# Patient Record
Sex: Female | Born: 1965 | Race: Black or African American | Hispanic: No | Marital: Single | Smoking: Never smoker
Health system: Southern US, Community
[De-identification: ages and names within clinical notes are randomized; demographics above are authoritative.]

## PROBLEM LIST (undated history)

## (undated) DIAGNOSIS — D332 Benign neoplasm of brain, unspecified: Secondary | ICD-10-CM

## (undated) HISTORY — PX: CARPAL TUNNEL RELEASE: SHX101

## (undated) HISTORY — PX: COLONOSCOPY: SHX174

## (undated) HISTORY — PX: TUBAL LIGATION: SHX77

---

## 2016-01-09 ENCOUNTER — Other Ambulatory Visit: Payer: Self-pay | Admitting: Neurosurgery

## 2016-01-09 DIAGNOSIS — D330 Benign neoplasm of brain, supratentorial: Secondary | ICD-10-CM

## 2016-01-17 ENCOUNTER — Other Ambulatory Visit: Payer: Self-pay

## 2016-01-18 ENCOUNTER — Ambulatory Visit
Admission: RE | Admit: 2016-01-18 | Discharge: 2016-01-18 | Disposition: A | Discharge status: Home / Self Care | Payer: BLUE CROSS/BLUE SHIELD | Source: Ambulatory Visit | Attending: Neurosurgery | Admitting: Neurosurgery

## 2016-01-18 DIAGNOSIS — D330 Benign neoplasm of brain, supratentorial: Secondary | ICD-10-CM

## 2016-01-18 MED ORDER — GADOBENATE DIMEGLUMINE 529 MG/ML IV SOLN
14.0000 mL | Freq: Once | INTRAVENOUS | Status: AC | PRN
  Administered 2016-01-18: 14 mL via INTRAVENOUS

## 2016-03-14 ENCOUNTER — Other Ambulatory Visit: Payer: Self-pay | Admitting: Neurosurgery

## 2016-08-14 NOTE — Pre-Procedure Instructions (Signed)
    Dana Hutchinson  08/14/2016     No Pharmacies Listed   Your procedure is scheduled on August 23, 2016.  Report to [facility] Admitting at 530 AM.  Call this number if you have problems the morning of surgery:  [phone]   Remember:  Do not eat food or drink liquids after midnight.  Take these medicines the morning of surgery with A SIP OF WATER (none).  7 days prior to surgery STOP taking any Aspirin, Aleve, Naproxen, Ibuprofen, Motrin, Advil, Goody's, BC's, all herbal medications, fish oil, and all vitamins   Do not wear jewelry, make-up or nail polish.  Do not wear lotions, powders, or perfumes, or deoderant.  Do not shave 48 hours prior to surgery.    Do not bring valuables to the hospital.  [facility] is not responsible for any belongings or valuables.  Contacts, dentures or bridgework may not be worn into surgery.  Leave your suitcase in the car.  After surgery it may be brought to your room.  For patients admitted to the hospital, discharge time will be determined by your treatment team.  Patients discharged the day of surgery will not be allowed to drive home.   Special instructions:   [facility]- Preparing For Surgery  Before surgery, you can play an important role. Because skin is not sterile, your skin needs to be as free of germs as possible. You can reduce the number of germs on your skin by washing with CHG (chlorahexidine gluconate) Soap before surgery.  CHG is an antiseptic cleaner which kills germs and bonds with the skin to continue killing germs even after washing.  Please do not use if you have an allergy to CHG or antibacterial soaps. If your skin becomes reddened/irritated stop using the CHG.  Do not shave (including legs and underarms) for at least 48 hours prior to first CHG shower. It is OK to shave your face.  Please follow these instructions carefully.   1. Shower the NIGHT BEFORE SURGERY and the MORNING OF SURGERY with CHG.   2. If you  chose to wash your hair, wash your hair first as usual with your normal shampoo.  3. After you shampoo, rinse your hair and body thoroughly to remove the shampoo.  4. Use CHG as you would any other liquid soap. You can apply CHG directly to the skin and wash gently with a scrungie or a clean washcloth.   5. Apply the CHG Soap to your body ONLY FROM THE NECK DOWN.  Do not use on open wounds or open sores. Avoid contact with your eyes, ears, mouth and genitals (private parts). Wash genitals (private parts) with your normal soap.  6. Wash thoroughly, paying special attention to the area where your surgery will be performed.  7. Thoroughly rinse your body with warm water from the neck down.  8. DO NOT shower/wash with your normal soap after using and rinsing off the CHG Soap.  9. Pat yourself dry with a CLEAN TOWEL.   10. Wear CLEAN PAJAMAS   11. Place CLEAN SHEETS on your bed the night of your first shower and DO NOT SLEEP WITH PETS.    Day of Surgery: Do not apply any deodorants/lotions. Please wear clean clothes to the hospital/surgery center.     Please read over the following fact sheets that you were given. Pain Booklet, Coughing and Deep Breathing and Surgical Site Infection Prevention

## 2016-08-15 ENCOUNTER — Inpatient Hospital Stay (HOSPITAL_COMMUNITY)
Admission: RE | Admit: 2016-08-15 | Discharge: 2016-08-15 | Disposition: A | Discharge status: Home / Self Care | Payer: BLUE CROSS/BLUE SHIELD | Source: Ambulatory Visit

## 2016-08-21 ENCOUNTER — Encounter (HOSPITAL_COMMUNITY)
Admission: RE | Admit: 2016-08-21 | Discharge: 2016-08-21 | Disposition: A | Discharge status: Home / Self Care | Payer: BLUE CROSS/BLUE SHIELD | Source: Ambulatory Visit | Attending: Neurosurgery | Admitting: Neurosurgery

## 2016-08-21 ENCOUNTER — Encounter (HOSPITAL_COMMUNITY): Payer: Self-pay | Admitting: *Deleted

## 2016-08-21 HISTORY — DX: Benign neoplasm of brain, unspecified: D33.2

## 2016-08-21 LAB — CBC
HEMATOCRIT: 38.6 % (ref 36.0–46.0)
HEMOGLOBIN: 12.6 g/dL (ref 12.0–15.0)
MCH: 28.3 pg (ref 26.0–34.0)
MCHC: 32.6 g/dL (ref 30.0–36.0)
MCV: 86.7 fL (ref 78.0–100.0)
Platelets: 288 10*3/uL (ref 150–400)
RBC: 4.45 MIL/uL (ref 3.87–5.11)
RDW: 12.6 % (ref 11.5–15.5)
WBC: 5.2 10*3/uL (ref 4.0–10.5)

## 2016-08-21 LAB — BASIC METABOLIC PANEL
ANION GAP: 7 (ref 5–15)
BUN: 10 mg/dL (ref 6–20)
CHLORIDE: 107 mmol/L (ref 101–111)
CO2: 25 mmol/L (ref 22–32)
Calcium: 8.8 mg/dL — ABNORMAL LOW (ref 8.9–10.3)
Creatinine, Ser: 0.64 mg/dL (ref 0.44–1.00)
GFR calc Af Amer: >60 mL/min (ref >60)
GFR calc non Af Amer: >60 mL/min (ref >60)
Glucose, Bld: 84 mg/dL (ref 65–99)
POTASSIUM: 3.6 mmol/L (ref 3.5–5.1)
SODIUM: 139 mmol/L (ref 135–145)

## 2016-08-21 LAB — TYPE AND SCREEN
ABO/RH(D): A NEG
ANTIBODY SCREEN: NEGATIVE

## 2016-08-21 LAB — HCG, SERUM, QUALITATIVE: Preg, Serum: NEGATIVE

## 2016-08-21 LAB — ABO/RH: ABO/RH(D): A NEG

## 2016-08-21 NOTE — Progress Notes (Signed)
PCP - denies Cardiologist -denies   Chest x-ray - not needed EKG - 08/21/16 Stress Test - denies ECHO - denies Cardiac Cath - denies       Patient denies shortness of breath, fever, cough and chest pain at PAT appointment   Patient verbalized understanding of instructions that were given to them at the PAT appointment. Patient was also instructed that they will need to review over the PAT instructions again at home before surgery.

## 2016-08-22 MED ORDER — CEFAZOLIN SODIUM-DEXTROSE 2-4 GM/100ML-% IV SOLN
2.0000 g | INTRAVENOUS | Status: AC
  Administered 2016-08-23: 2 g via INTRAVENOUS
  Filled 2016-08-22: qty 100

## 2016-08-23 ENCOUNTER — Encounter (HOSPITAL_COMMUNITY)
Admission: RE | Disposition: A | Discharge status: Home / Self Care | Payer: Self-pay | Source: Ambulatory Visit | Attending: Neurosurgery

## 2016-08-23 ENCOUNTER — Inpatient Hospital Stay (HOSPITAL_COMMUNITY): Payer: BLUE CROSS/BLUE SHIELD | Admitting: Certified Registered"

## 2016-08-23 ENCOUNTER — Encounter (HOSPITAL_COMMUNITY): Payer: Self-pay | Admitting: *Deleted

## 2016-08-23 ENCOUNTER — Inpatient Hospital Stay (HOSPITAL_COMMUNITY)
Admission: RE | Admit: 2016-08-23 | Discharge: 2016-08-27 | DRG: 027 | Disposition: A | Discharge status: Home / Self Care | Payer: BLUE CROSS/BLUE SHIELD | Source: Ambulatory Visit | Attending: Neurosurgery | Admitting: Neurosurgery

## 2016-08-23 ENCOUNTER — Inpatient Hospital Stay (HOSPITAL_COMMUNITY): Payer: BLUE CROSS/BLUE SHIELD | Admitting: Vascular Surgery

## 2016-08-23 DIAGNOSIS — C715 Malignant neoplasm of cerebral ventricle: Principal | ICD-10-CM | POA: Diagnosis present

## 2016-08-23 DIAGNOSIS — Z9851 Tubal ligation status: Secondary | ICD-10-CM | POA: Diagnosis not present

## 2016-08-23 DIAGNOSIS — D496 Neoplasm of unspecified behavior of brain: Secondary | ICD-10-CM | POA: Diagnosis present

## 2016-08-23 DIAGNOSIS — W19XXXA Unspecified fall, initial encounter: Secondary | ICD-10-CM | POA: Diagnosis present

## 2016-08-23 DIAGNOSIS — R51 Headache: Secondary | ICD-10-CM | POA: Diagnosis present

## 2016-08-23 DIAGNOSIS — Z9889 Other specified postprocedural states: Secondary | ICD-10-CM

## 2016-08-23 HISTORY — PX: APPLICATION OF CRANIAL NAVIGATION: SHX6578

## 2016-08-23 HISTORY — PX: CRANIOTOMY: SHX93

## 2016-08-23 LAB — POCT I-STAT 7, (LYTES, BLD GAS, ICA,H+H)
Acid-base deficit: 3 mmol/L — ABNORMAL HIGH (ref 0.0–2.0)
BICARBONATE: 20.9 mmol/L (ref 20.0–28.0)
Calcium, Ion: 1.09 mmol/L — ABNORMAL LOW (ref 1.15–1.40)
HCT: 28 % — ABNORMAL LOW (ref 36.0–46.0)
Hemoglobin: 9.5 g/dL — ABNORMAL LOW (ref 12.0–15.0)
O2 Saturation: 100 %
PCO2 ART: 30.7 mmHg — AB (ref 32.0–48.0)
POTASSIUM: 3.2 mmol/L — AB (ref 3.5–5.1)
Sodium: 144 mmol/L (ref 135–145)
TCO2: 22 mmol/L (ref 0–100)
pH, Arterial: 7.435 (ref 7.350–7.450)
pO2, Arterial: 281 mmHg — ABNORMAL HIGH (ref 83.0–108.0)

## 2016-08-23 LAB — MRSA PCR SCREENING: MRSA by PCR: NEGATIVE

## 2016-08-23 SURGERY — CRANIOTOMY TUMOR EXCISION
Anesthesia: General | Site: Head | Laterality: Right

## 2016-08-23 MED ORDER — THROMBIN 5000 UNITS EX SOLR
OROMUCOSAL | Status: DC | PRN
  Administered 2016-08-23: 10:00:00 via TOPICAL

## 2016-08-23 MED ORDER — MIDAZOLAM HCL 2 MG/2ML IJ SOLN
INTRAMUSCULAR | Status: AC
  Filled 2016-08-23: qty 2

## 2016-08-23 MED ORDER — HYDROMORPHONE HCL 1 MG/ML IJ SOLN: 0.2500 mg | INTRAMUSCULAR | Status: DC | PRN

## 2016-08-23 MED ORDER — MORPHINE SULFATE (PF) 4 MG/ML IV SOLN
1.0000 mg | INTRAVENOUS | Status: DC | PRN
  Administered 2016-08-23: 1 mg via INTRAVENOUS
  Filled 2016-08-23: qty 1

## 2016-08-23 MED ORDER — PROPOFOL 10 MG/ML IV BOLUS
INTRAVENOUS | Status: AC
  Filled 2016-08-23: qty 20

## 2016-08-23 MED ORDER — LIDOCAINE 2% (20 MG/ML) 5 ML SYRINGE
INTRAMUSCULAR | Status: AC
  Filled 2016-08-23: qty 5

## 2016-08-23 MED ORDER — PHENYLEPHRINE HCL 10 MG/ML IJ SOLN
INTRAVENOUS | Status: DC | PRN
  Administered 2016-08-23: 40 ug/min via INTRAVENOUS

## 2016-08-23 MED ORDER — EPHEDRINE SULFATE-NACL 50-0.9 MG/10ML-% IV SOSY
PREFILLED_SYRINGE | INTRAVENOUS | Status: DC | PRN
  Administered 2016-08-23: 10 mg via INTRAVENOUS

## 2016-08-23 MED ORDER — BACITRACIN ZINC 500 UNIT/GM EX OINT
TOPICAL_OINTMENT | CUTANEOUS | Status: AC
  Filled 2016-08-23: qty 28.35

## 2016-08-23 MED ORDER — PHENYLEPHRINE 40 MCG/ML (10ML) SYRINGE FOR IV PUSH (FOR BLOOD PRESSURE SUPPORT)
PREFILLED_SYRINGE | INTRAVENOUS | Status: DC | PRN
  Administered 2016-08-23 (×3): 80 ug via INTRAVENOUS

## 2016-08-23 MED ORDER — PHENYLEPHRINE 40 MCG/ML (10ML) SYRINGE FOR IV PUSH (FOR BLOOD PRESSURE SUPPORT)
PREFILLED_SYRINGE | INTRAVENOUS | Status: AC
  Filled 2016-08-23: qty 10

## 2016-08-23 MED ORDER — DEXAMETHASONE SODIUM PHOSPHATE 10 MG/ML IJ SOLN
6.0000 mg | Freq: Four times a day (QID) | INTRAMUSCULAR | Status: AC
  Administered 2016-08-23 – 2016-08-24 (×3): 6 mg via INTRAVENOUS
  Filled 2016-08-23 (×3): qty 1

## 2016-08-23 MED ORDER — DEXAMETHASONE SODIUM PHOSPHATE 10 MG/ML IJ SOLN
INTRAMUSCULAR | Status: AC
  Filled 2016-08-23: qty 1

## 2016-08-23 MED ORDER — LABETALOL HCL 5 MG/ML IV SOLN: 10.0000 mg | INTRAVENOUS | Status: DC | PRN

## 2016-08-23 MED ORDER — FENTANYL CITRATE (PF) 250 MCG/5ML IJ SOLN
INTRAMUSCULAR | Status: AC
  Filled 2016-08-23: qty 5

## 2016-08-23 MED ORDER — BACITRACIN ZINC 500 UNIT/GM EX OINT
TOPICAL_OINTMENT | CUTANEOUS | Status: DC | PRN
  Administered 2016-08-23 (×2): 1 via TOPICAL

## 2016-08-23 MED ORDER — DOCUSATE SODIUM 100 MG PO CAPS
100.0000 mg | ORAL_CAPSULE | Freq: Two times a day (BID) | ORAL | Status: DC
  Administered 2016-08-23 – 2016-08-27 (×8): 100 mg via ORAL
  Filled 2016-08-23 (×8): qty 1

## 2016-08-23 MED ORDER — ONDANSETRON HCL 4 MG/2ML IJ SOLN
INTRAMUSCULAR | Status: DC | PRN
  Administered 2016-08-23: 4 mg via INTRAVENOUS

## 2016-08-23 MED ORDER — MEPERIDINE HCL 25 MG/ML IJ SOLN: 6.2500 mg | INTRAMUSCULAR | Status: DC | PRN

## 2016-08-23 MED ORDER — ARTIFICIAL TEARS OPHTHALMIC OINT
TOPICAL_OINTMENT | OPHTHALMIC | Status: AC
  Filled 2016-08-23: qty 3.5

## 2016-08-23 MED ORDER — ONDANSETRON HCL 4 MG/2ML IJ SOLN
4.0000 mg | INTRAMUSCULAR | Status: DC | PRN
  Administered 2016-08-23: 4 mg via INTRAVENOUS

## 2016-08-23 MED ORDER — ROCURONIUM BROMIDE 100 MG/10ML IV SOLN
INTRAVENOUS | Status: DC | PRN
  Administered 2016-08-23: 50 mg via INTRAVENOUS
  Administered 2016-08-23 (×6): 10 mg via INTRAVENOUS

## 2016-08-23 MED ORDER — DEXAMETHASONE SODIUM PHOSPHATE 10 MG/ML IJ SOLN
INTRAMUSCULAR | Status: DC | PRN
  Administered 2016-08-23: 10 mg via INTRAVENOUS

## 2016-08-23 MED ORDER — THROMBIN 5000 UNITS EX SOLR
CUTANEOUS | Status: AC
  Filled 2016-08-23: qty 5000

## 2016-08-23 MED ORDER — SODIUM CHLORIDE 0.9 % IV SOLN
1000.0000 mg | INTRAVENOUS | Status: AC
  Administered 2016-08-23: 1000 mg via INTRAVENOUS
  Filled 2016-08-23: qty 10

## 2016-08-23 MED ORDER — DEXAMETHASONE SODIUM PHOSPHATE 4 MG/ML IJ SOLN
4.0000 mg | Freq: Three times a day (TID) | INTRAMUSCULAR | Status: DC
  Administered 2016-08-25 – 2016-08-27 (×6): 4 mg via INTRAVENOUS
  Filled 2016-08-23 (×6): qty 1

## 2016-08-23 MED ORDER — ONDANSETRON HCL 4 MG PO TABS: 4.0000 mg | ORAL_TABLET | ORAL | Status: DC | PRN

## 2016-08-23 MED ORDER — SUGAMMADEX SODIUM 200 MG/2ML IV SOLN
INTRAVENOUS | Status: DC | PRN
  Administered 2016-08-23: 200 mg via INTRAVENOUS

## 2016-08-23 MED ORDER — PROMETHAZINE HCL 12.5 MG PO TABS
12.5000 mg | ORAL_TABLET | ORAL | Status: DC | PRN
  Filled 2016-08-23: qty 2

## 2016-08-23 MED ORDER — BUPIVACAINE HCL (PF) 0.5 % IJ SOLN
INTRAMUSCULAR | Status: DC | PRN
  Administered 2016-08-23: 5 mL

## 2016-08-23 MED ORDER — PANTOPRAZOLE SODIUM 40 MG IV SOLR
40.0000 mg | Freq: Every day | INTRAVENOUS | Status: DC
  Administered 2016-08-23 – 2016-08-25 (×3): 40 mg via INTRAVENOUS
  Filled 2016-08-23 (×3): qty 40

## 2016-08-23 MED ORDER — ONDANSETRON HCL 4 MG/2ML IJ SOLN
4.0000 mg | Freq: Once | INTRAMUSCULAR | Status: DC | PRN
  Filled 2016-08-23: qty 2

## 2016-08-23 MED ORDER — ROCURONIUM BROMIDE 10 MG/ML (PF) SYRINGE
PREFILLED_SYRINGE | INTRAVENOUS | Status: AC
  Filled 2016-08-23: qty 5

## 2016-08-23 MED ORDER — HEMOSTATIC AGENTS (NO CHARGE) OPTIME
TOPICAL | Status: DC | PRN
  Administered 2016-08-23: 1 via TOPICAL

## 2016-08-23 MED ORDER — EPHEDRINE 5 MG/ML INJ
INTRAVENOUS | Status: AC
  Filled 2016-08-23: qty 10

## 2016-08-23 MED ORDER — SODIUM CHLORIDE 0.9 % IV SOLN
500.0000 mg | Freq: Two times a day (BID) | INTRAVENOUS | Status: DC
  Administered 2016-08-23 – 2016-08-24 (×2): 500 mg via INTRAVENOUS
  Filled 2016-08-23 (×3): qty 5

## 2016-08-23 MED ORDER — LIDOCAINE 2% (20 MG/ML) 5 ML SYRINGE
INTRAMUSCULAR | Status: DC | PRN
  Administered 2016-08-23: 100 mg via INTRAVENOUS

## 2016-08-23 MED ORDER — SODIUM CHLORIDE 0.9 % IV SOLN
INTRAVENOUS | Status: DC | PRN
  Administered 2016-08-23 (×2): via INTRAVENOUS

## 2016-08-23 MED ORDER — SODIUM CHLORIDE 0.9 % IV SOLN
INTRAVENOUS | Status: DC
  Administered 2016-08-24 – 2016-08-25 (×3): via INTRAVENOUS

## 2016-08-23 MED ORDER — THROMBIN 20000 UNITS EX SOLR
CUTANEOUS | Status: AC
  Filled 2016-08-23: qty 20000

## 2016-08-23 MED ORDER — THROMBIN 20000 UNITS EX SOLR
CUTANEOUS | Status: DC | PRN
  Administered 2016-08-23: 10:00:00 via TOPICAL

## 2016-08-23 MED ORDER — SUGAMMADEX SODIUM 200 MG/2ML IV SOLN
INTRAVENOUS | Status: AC
  Filled 2016-08-23: qty 2

## 2016-08-23 MED ORDER — ADULT MULTIVITAMIN W/MINERALS CH
1.0000 | ORAL_TABLET | Freq: Every day | ORAL | Status: DC
  Administered 2016-08-24 – 2016-08-27 (×3): 1 via ORAL
  Filled 2016-08-23 (×6): qty 1

## 2016-08-23 MED ORDER — SUCCINYLCHOLINE CHLORIDE 200 MG/10ML IV SOSY
PREFILLED_SYRINGE | INTRAVENOUS | Status: AC
  Filled 2016-08-23: qty 10

## 2016-08-23 MED ORDER — CEFAZOLIN SODIUM-DEXTROSE 1-4 GM/50ML-% IV SOLN
1.0000 g | Freq: Three times a day (TID) | INTRAVENOUS | Status: AC
  Administered 2016-08-23 – 2016-08-24 (×2): 1 g via INTRAVENOUS
  Filled 2016-08-23 (×4): qty 50

## 2016-08-23 MED ORDER — LABETALOL HCL 5 MG/ML IV SOLN
INTRAVENOUS | Status: DC | PRN
  Administered 2016-08-23: 10 mg via INTRAVENOUS

## 2016-08-23 MED ORDER — FENTANYL CITRATE (PF) 100 MCG/2ML IJ SOLN
INTRAMUSCULAR | Status: DC | PRN
  Administered 2016-08-23: 50 ug via INTRAVENOUS
  Administered 2016-08-23: 200 ug via INTRAVENOUS

## 2016-08-23 MED ORDER — SENNA 8.6 MG PO TABS
1.0000 | ORAL_TABLET | Freq: Two times a day (BID) | ORAL | Status: DC
  Administered 2016-08-23 – 2016-08-27 (×7): 8.6 mg via ORAL
  Filled 2016-08-23 (×8): qty 1

## 2016-08-23 MED ORDER — BISACODYL 10 MG RE SUPP: 10.0000 mg | Freq: Every day | RECTAL | Status: DC | PRN

## 2016-08-23 MED ORDER — PROPOFOL 10 MG/ML IV BOLUS
INTRAVENOUS | Status: DC | PRN
  Administered 2016-08-23: 200 mg via INTRAVENOUS
  Administered 2016-08-23: 50 mg via INTRAVENOUS

## 2016-08-23 MED ORDER — HYDROCODONE-ACETAMINOPHEN 5-325 MG PO TABS
1.0000 | ORAL_TABLET | ORAL | Status: DC | PRN
  Administered 2016-08-23 – 2016-08-27 (×13): 1 via ORAL
  Filled 2016-08-23 (×14): qty 1

## 2016-08-23 MED ORDER — MORPHINE SULFATE (PF) 2 MG/ML IV SOLN: 1.0000 mg | INTRAVENOUS | Status: DC | PRN

## 2016-08-23 MED ORDER — DEXAMETHASONE SODIUM PHOSPHATE 4 MG/ML IJ SOLN
4.0000 mg | Freq: Four times a day (QID) | INTRAMUSCULAR | Status: AC
  Administered 2016-08-24 – 2016-08-25 (×4): 4 mg via INTRAVENOUS
  Filled 2016-08-23 (×4): qty 1

## 2016-08-23 MED ORDER — BACITRACIN 50000 UNITS IM SOLR
INTRAMUSCULAR | Status: DC | PRN
  Administered 2016-08-23: 10:00:00

## 2016-08-23 MED ORDER — LIDOCAINE-EPINEPHRINE 1 %-1:100000 IJ SOLN
INTRAMUSCULAR | Status: DC | PRN
  Administered 2016-08-23: 5 mL

## 2016-08-23 MED ORDER — 0.9 % SODIUM CHLORIDE (POUR BTL) OPTIME
TOPICAL | Status: DC | PRN
  Administered 2016-08-23 (×4): 1000 mL

## 2016-08-23 MED ORDER — LIDOCAINE-EPINEPHRINE 1 %-1:100000 IJ SOLN
INTRAMUSCULAR | Status: AC
  Filled 2016-08-23: qty 1

## 2016-08-23 MED ORDER — ONDANSETRON HCL 4 MG/2ML IJ SOLN
INTRAMUSCULAR | Status: AC
  Filled 2016-08-23: qty 2

## 2016-08-23 MED ORDER — ONDANSETRON HCL 4 MG/2ML IJ SOLN: 4.0000 mg | Freq: Once | INTRAMUSCULAR | Status: DC | PRN

## 2016-08-23 MED ORDER — BUPIVACAINE HCL (PF) 0.5 % IJ SOLN
INTRAMUSCULAR | Status: AC
  Filled 2016-08-23: qty 30

## 2016-08-23 SURGICAL SUPPLY — 113 items
ACCESS SYS VIEWSITE 21X15X7 (NEUROSURGERY SUPPLIES) ×2
ACCESS SYS VIEWSITE 21X15X7CM (NEUROSURGERY SUPPLIES) ×1
BANDAGE GAUZE 4  KLING STR (GAUZE/BANDAGES/DRESSINGS) ×3 IMPLANT
BATTERY IQ STERILE (MISCELLANEOUS) ×3 IMPLANT
BENZOIN TINCTURE PRP APPL 2/3 (GAUZE/BANDAGES/DRESSINGS) IMPLANT
BLADE CLIPPER SURG (BLADE) ×6 IMPLANT
BLADE SAW GIGLI 16 STRL (MISCELLANEOUS) IMPLANT
BLADE SURG 15 STRL LF DISP TIS (BLADE) IMPLANT
BLADE SURG 15 STRL SS (BLADE)
BLADE ULTRA TIP 2M (BLADE) IMPLANT
BNDG GAUZE ELAST 4 BULKY (GAUZE/BANDAGES/DRESSINGS) ×6 IMPLANT
BUR ACORN 6.0 PRECISION (BURR) ×2 IMPLANT
BUR ACORN 6.0MM PRECISION (BURR) ×1
BUR ROUND FLUTED 4 SOFT TCH (BURR) IMPLANT
BUR ROUND FLUTED 4MM SOFT TCH (BURR)
BUR SPIRAL ROUTER 2.3 (BUR) ×2 IMPLANT
BUR SPIRAL ROUTER 2.3MM (BUR) ×1
CANISTER SUCT 3000ML PPV (MISCELLANEOUS) ×6 IMPLANT
CARTRIDGE OIL MAESTRO DRILL (MISCELLANEOUS) ×2 IMPLANT
CATH VENTRIC 35X38 W/TROCAR LG (CATHETERS) ×3 IMPLANT
CLIP TI MEDIUM 6 (CLIP) IMPLANT
CONT SPEC 4OZ CLIKSEAL STRL BL (MISCELLANEOUS) ×3 IMPLANT
COVER MAYO STAND STRL (DRAPES) IMPLANT
CURVED EXTENDED SHEAR TIP ×3 IMPLANT
DECANTER SPIKE VIAL GLASS SM (MISCELLANEOUS) ×3 IMPLANT
DIFFUSER DRILL AIR PNEUMATIC (MISCELLANEOUS) ×3 IMPLANT
DRAIN SUBARACHNOID (WOUND CARE) IMPLANT
DRAPE HALF SHEET 40X57 (DRAPES) ×3 IMPLANT
DRAPE MICROSCOPE LEICA (MISCELLANEOUS) ×3 IMPLANT
DRAPE NEUROLOGICAL W/INCISE (DRAPES) ×3 IMPLANT
DRAPE STERI IOBAN 125X83 (DRAPES) IMPLANT
DRAPE SURG 17X23 STRL (DRAPES) IMPLANT
DRAPE WARM FLUID 44X44 (DRAPE) ×3 IMPLANT
DRSG ADAPTIC 3X8 NADH LF (GAUZE/BANDAGES/DRESSINGS) IMPLANT
DRSG TELFA 3X8 NADH (GAUZE/BANDAGES/DRESSINGS) ×3 IMPLANT
DURAMATRIX ONLAY 3X3 (Plate) ×3 IMPLANT
DURAPREP 6ML APPLICATOR 50/CS (WOUND CARE) ×3 IMPLANT
ELECT REM PT RETURN 9FT ADLT (ELECTROSURGICAL) ×3
ELECTRODE REM PT RTRN 9FT ADLT (ELECTROSURGICAL) ×1 IMPLANT
EVACUATOR 1/8 PVC DRAIN (DRAIN) IMPLANT
EVACUATOR SILICONE 100CC (DRAIN) IMPLANT
FORCEPS BIPOLAR SPETZLER 8 1.0 (NEUROSURGERY SUPPLIES) ×3 IMPLANT
FORCEPS BIPOLAR TI 190MM (INSTRUMENTS) ×3 IMPLANT
GAUZE SPONGE 4X4 12PLY STRL (GAUZE/BANDAGES/DRESSINGS) IMPLANT
GAUZE SPONGE 4X4 16PLY XRAY LF (GAUZE/BANDAGES/DRESSINGS) IMPLANT
GLOVE BIO SURGEON STRL SZ7 (GLOVE) IMPLANT
GLOVE BIOGEL PI IND STRL 7.0 (GLOVE) IMPLANT
GLOVE BIOGEL PI IND STRL 7.5 (GLOVE) ×5 IMPLANT
GLOVE BIOGEL PI INDICATOR 7.0 (GLOVE)
GLOVE BIOGEL PI INDICATOR 7.5 (GLOVE) ×10
GLOVE ECLIPSE 6.5 STRL STRAW (GLOVE) ×3 IMPLANT
GLOVE ECLIPSE 7.0 STRL STRAW (GLOVE) ×12 IMPLANT
GLOVE EXAM NITRILE LRG STRL (GLOVE) IMPLANT
GLOVE EXAM NITRILE XL STR (GLOVE) IMPLANT
GLOVE EXAM NITRILE XS STR PU (GLOVE) IMPLANT
GOWN STRL REUS W/ TWL LRG LVL3 (GOWN DISPOSABLE) ×4 IMPLANT
GOWN STRL REUS W/ TWL XL LVL3 (GOWN DISPOSABLE) ×2 IMPLANT
GOWN STRL REUS W/TWL 2XL LVL3 (GOWN DISPOSABLE) IMPLANT
GOWN STRL REUS W/TWL LRG LVL3 (GOWN DISPOSABLE) ×8
GOWN STRL REUS W/TWL XL LVL3 (GOWN DISPOSABLE) ×4
HEMOSTAT POWDER KIT SURGIFOAM (HEMOSTASIS) ×3 IMPLANT
HEMOSTAT SURGICEL 2X14 (HEMOSTASIS) ×3 IMPLANT
HOOK DURA 1/2IN (MISCELLANEOUS) ×3 IMPLANT
IV NS 1000ML (IV SOLUTION) ×2
IV NS 1000ML BAXH (IV SOLUTION) ×1 IMPLANT
KIT BASIN OR (CUSTOM PROCEDURE TRAY) ×3 IMPLANT
KIT DRAIN CSF ACCUDRAIN (MISCELLANEOUS) ×3 IMPLANT
KIT ROOM TURNOVER OR (KITS) ×3 IMPLANT
KNIFE ARACHNOID DISP AM-24-S (MISCELLANEOUS) IMPLANT
MARKER SKIN DUAL TIP RULER LAB (MISCELLANEOUS) ×6 IMPLANT
MARKER SPHERE PSV REFLC 13MM (MARKER) ×6 IMPLANT
NEEDLE HYPO 22GX1.5 SAFETY (NEEDLE) ×3 IMPLANT
NEEDLE SPNL 18GX3.5 QUINCKE PK (NEEDLE) IMPLANT
NS IRRIG 1000ML POUR BTL (IV SOLUTION) ×12 IMPLANT
OIL CARTRIDGE MAESTRO DRILL (MISCELLANEOUS) ×6
PACK CRANIOTOMY (CUSTOM PROCEDURE TRAY) ×3 IMPLANT
PATTIES SURGICAL .25X.25 (GAUZE/BANDAGES/DRESSINGS) IMPLANT
PATTIES SURGICAL .5 X.5 (GAUZE/BANDAGES/DRESSINGS) ×3 IMPLANT
PATTIES SURGICAL .5 X3 (DISPOSABLE) IMPLANT
PATTIES SURGICAL 1/4 X 3 (GAUZE/BANDAGES/DRESSINGS) IMPLANT
PATTIES SURGICAL 1X1 (DISPOSABLE) IMPLANT
PIN MAYFIELD SKULL DISP (PIN) ×3 IMPLANT
PLATE 1.5/0.5 13MM BURR HOLE (Plate) ×6 IMPLANT
PLATE SHUNT (Plate) ×3 IMPLANT
QUICK CONNECT CARTRIDGE AND TUBING SET ×3 IMPLANT
RUBBERBAND STERILE (MISCELLANEOUS) ×6 IMPLANT
SCREW SELF DRILL HT 1.5/4MM (Screw) ×30 IMPLANT
SET TUBING W/EXT DISP (INSTRUMENTS) IMPLANT
SPECIMEN JAR SMALL (MISCELLANEOUS) IMPLANT
SPONGE NEURO XRAY DETECT 1X3 (DISPOSABLE) IMPLANT
SPONGE SURGIFOAM ABS GEL 100 (HEMOSTASIS) ×3 IMPLANT
STAPLER VISISTAT 35W (STAPLE) ×6 IMPLANT
STOCKINETTE 6  STRL (DRAPES) ×2
STOCKINETTE 6 STRL (DRAPES) ×1 IMPLANT
SUT ETHILON 3 0 FSL (SUTURE) IMPLANT
SUT ETHILON 3 0 PS 1 (SUTURE) IMPLANT
SUT NURALON 4 0 TR CR/8 (SUTURE) ×6 IMPLANT
SUT SILK 0 TIES 10X30 (SUTURE) IMPLANT
SUT SILK 2 0 TIES 10X30 (SUTURE) ×3 IMPLANT
SUT VIC AB 0 CT1 18XCR BRD8 (SUTURE) ×2 IMPLANT
SUT VIC AB 0 CT1 8-18 (SUTURE) ×4
SUT VIC AB 3-0 SH 8-18 (SUTURE) ×3 IMPLANT
SYSTEM ACCESS VIEWSITE 21X15X7 (NEUROSURGERY SUPPLIES) ×1 IMPLANT
TIP NONSTICK .5MMX23CM (INSTRUMENTS) ×2
TIP NONSTICK .5X23 (INSTRUMENTS) ×1 IMPLANT
TIP STRAIGHT 25KHZ (INSTRUMENTS) IMPLANT
TOWEL GREEN STERILE (TOWEL DISPOSABLE) ×3 IMPLANT
TOWEL GREEN STERILE FF (TOWEL DISPOSABLE) ×3 IMPLANT
TRAY FOLEY W/METER SILVER 16FR (SET/KITS/TRAYS/PACK) ×3 IMPLANT
TUBE CONNECTING 12'X1/4 (SUCTIONS)
TUBE CONNECTING 12X1/4 (SUCTIONS) IMPLANT
UNDERPAD 30X30 (UNDERPADS AND DIAPERS) IMPLANT
WATER STERILE IRR 1000ML POUR (IV SOLUTION) ×3 IMPLANT

## 2016-08-23 NOTE — Progress Notes (Signed)
Pt seen postop in ICU. Sleeping, but easily arousable. Does c/o HA. No other complaints.  EXAM:  BP (!) 111/57   Pulse 82   Temp 98.5 F (36.9 C) (Oral)   Resp (!) 25   Ht 5' (1.524 m)   Wt 69.6 kg (153 lb 7 oz)   LMP 08/18/2016   SpO2 98%   BMI 29.97 kg/m   Awake, alert, oriented  Speech fluent, appropriate  Naming/repetition intact CN grossly intact  5/5 BUE/BLE   IMPRESSION:  51 y.o. female s/p right frontal transcortical approach for resection of intraventricular tumor. Appears to be at neurologic baseline.  PLAN: - Cont supportive care, SBP goal < 167mmHg - EVD open at 25mmHg for now, can consider clamping and removing over weekend if stable - Postop MRI within 48 hrs

## 2016-08-23 NOTE — Anesthesia Preprocedure Evaluation (Addendum)
Anesthesia Evaluation  Patient identified by MRN, date of birth, ID band Patient awake    Reviewed: Allergy & Precautions, NPO status , Patient's Chart, lab work & pertinent test results  Airway Mallampati: I  TM Distance: >3 FB Neck ROM: Full    Dental  (+) Dental Advisory Given   Pulmonary    Pulmonary exam normal        Cardiovascular Normal cardiovascular exam     Neuro/Psych    GI/Hepatic   Endo/Other    Renal/GU      Musculoskeletal   Abdominal   Peds  Hematology   Anesthesia Other Findings   Reproductive/Obstetrics                            Anesthesia Physical Anesthesia Plan  ASA: III  Anesthesia Plan: General   Post-op Pain Management:    Induction: Intravenous  PONV Risk Score and Plan: 3 and Ondansetron, Dexamethasone, Propofol and Midazolam  Airway Management Planned: Oral ETT  Additional Equipment: Arterial line  Intra-op Plan: Utilization of Controlled Hypotension per surrgeon request  Post-operative Plan: Extubation in OR  Informed Consent: I have reviewed the patients History and Physical, chart, labs and discussed the procedure including the risks, benefits and alternatives for the proposed anesthesia with the patient or authorized representative who has indicated his/her understanding and acceptance.   Dental advisory given  Plan Discussed with: CRNA and Surgeon  Anesthesia Plan Comments:       Anesthesia Quick Evaluation

## 2016-08-23 NOTE — H&P (Signed)
CC:  Brain tumor  HPI: [name] is a 51 year old woman I'm seeing for the above, after a recent trip to the emergency department after a fall. She says she was at work, working on a lift, when she fell and landed on her wrist, and also hit her head. She was taken the emergency department, when she was complaining of headache and some dizziness. CT scan was done which demonstrated an intracranial mass. She was therefore referred for outpatient neurosurgical follow-up. She says the headache and dizziness quickly resolved, and she feels back to normal. She does not have any symptoms currently. She did not have chronic headaches prior to her fall, nor does she describe any changes in her vision, numbness tingling or weakness of the arms or legs, imbalance. Of note, there is no history of malignancy.   PMH: Past Medical History:  Diagnosis Date  . Brain tumor (benign) ([place])     PSH: Past Surgical History:  Procedure Laterality Date  . CARPAL TUNNEL RELEASE Bilateral   . COLONOSCOPY    . TUBAL LIGATION      SH: Social History  Substance Use Topics  . Smoking status: Never Smoker  . Smokeless tobacco: Never Used  . Alcohol use No    MEDS: Prior to Admission medications   Medication Sig Start Date End Date Taking? Authorizing Provider  ibuprofen (ADVIL,MOTRIN) 800 MG tablet Take 1 tablet by mouth every 8 (eight) hours as needed for cramping. 07/16/16  Yes [provider]  Multiple Vitamin (MULTIVITAMIN) tablet Take 1 tablet by mouth daily.   Yes [provider]    ALLERGY: Allergies  Allergen Reactions  . No Known Allergies     ROS: ROS  NEUROLOGIC EXAM: Awake, alert, oriented Memory and concentration grossly intact Speech fluent, appropriate CN grossly intact Motor exam: Upper Extremities Deltoid Bicep Tricep Grip  Right 5/5 5/5 5/5 5/5  Left 5/5 5/5 5/5 5/5   Lower Extremity IP Quad PF DF EHL  Right 5/5 5/5 5/5 5/5 5/5  Left 5/5 5/5 5/5 5/5 5/5    Sensation grossly intact to LT  [facility]: MRI of the brain with and without contrast was reviewed. This demonstrates a heterogeneously enhancing lesion in the right lateral ventricle at the level of the foramen of [place]. Lesion measures approximately 2.5 x 2.5 x 2.5 cm. There is slight asymmetric enlargement of the right lateral ventricle.  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  PLAN: We will plan on right frontal craniotomy for transcortical resection of the intraventricular tumor   I have reviewed the MRI images and findings with the patient and her daughter in the office. Given the size of lesion and its location, as well as 51 years old, I did recommend surgical resection. The expected operative and postoperative course was reviewed. Risks of the surgery were discussed in detail including the risk of stroke leading to numbness tingling weakness paralysis coma or death, bleeding, infection, seizures, hydrocephalus requiring shunting, and the general risks of anesthesia including heart attack stroke and DVT/PE. The patient appeared to understand our discussion. All her questions were answered. She is willing to proceed as above.

## 2016-08-23 NOTE — Transfer of Care (Signed)
Immediate Anesthesia Transfer of Care Note  Patient: Amatullah Christy  Procedure(s) Performed: Procedure(s): STERIOTACTIC RIGHT FRONTAL CRANIOTOMY FOR RESECTION OF TUMOR (Right) APPLICATION OF CRANIAL NAVIGATION (Right)  Patient Location: PACU  Anesthesia Type:General  Level of Consciousness: unresponsive and drowsy  Airway & Oxygen Therapy: Patient Spontanous Breathing and Patient connected to nasal cannula oxygen  Post-op Assessment: Report given to RN and Post -op Vital signs reviewed and stable  Post vital signs: Reviewed and stable  Last Vitals:  Vitals:   08/23/16 0546  BP: 133/62  Pulse: (!) 57  Resp: 20  Temp: 36.7 C    Last Pain:  Vitals:   08/23/16 0546  TempSrc: Oral         Complications: No apparent anesthesia complications

## 2016-08-23 NOTE — Anesthesia Postprocedure Evaluation (Signed)
Anesthesia Post Note  Patient: Dana Hutchinson  Procedure(s) Performed: Procedure(s) (LRB): STERIOTACTIC RIGHT FRONTAL CRANIOTOMY FOR RESECTION OF TUMOR (Right) APPLICATION OF CRANIAL NAVIGATION (Right)     Patient location during evaluation: PACU Anesthesia Type: General Level of consciousness: awake and alert Pain management: pain level controlled Vital Signs Assessment: post-procedure vital signs reviewed and stable Respiratory status: spontaneous breathing, nonlabored ventilation, respiratory function stable and patient connected to nasal cannula oxygen Cardiovascular status: blood pressure returned to baseline and stable Postop Assessment: no signs of nausea or vomiting Anesthetic complications: no    Last Vitals:  Vitals:   08/23/16 1400 08/23/16 1500  BP: (!) 112/55 (!) 105/57  Pulse: 76 82  Resp: (!) 31 (!) 29  Temp:      Last Pain:  Vitals:   08/23/16 1340  TempSrc: Oral  PainSc:                  Ivann Trimarco DAVID

## 2016-08-23 NOTE — Anesthesia Procedure Notes (Signed)
Procedure Name: Intubation Date/Time: 08/23/2016 8:03 AM Performed by: Orlie Dakin Pre-anesthesia Checklist: Patient identified, Emergency Drugs available, Suction available, Patient being monitored and Timeout performed Patient Re-evaluated:Patient Re-evaluated prior to inductionOxygen Delivery Method: Circle system utilized Preoxygenation: Pre-oxygenation with 100% oxygen Intubation Type: IV induction Ventilation: Mask ventilation without difficulty Laryngoscope Size: Miller and 3 Grade View: Grade I Tube type: Oral Tube size: 7.5 mm Airway Equipment and Method: Stylet Placement Confirmation: ETT inserted through vocal cords under direct vision,  positive ETCO2 and breath sounds checked- equal and bilateral Secured at: 23 cm Tube secured with: Tape Dental Injury: Teeth and Oropharynx as per pre-operative assessment  Comments: Gums and lips as noted pre-op after DL.

## 2016-08-23 NOTE — Op Note (Signed)
PREOP DIAGNOSIS:  1. Intraventricular brain tumor   POSTOP DIAGNOSIS: Same  PROCEDURE: 1. Stereotactic right frontal craniotomy for resection of intraventricular tumor 2. Use of intraoperative microscope for microdissection  SURGEON: Dr. Consuella Lose, MD  ASSISTANT: Dr. Ashok Pall, MD  ANESTHESIA: General Endotracheal  EBL: 200cc  SPECIMENS: Intraventricular tumor for frozen and permanent section  DRAINS: right frontal IVC  COMPLICATIONS: None immediate  CONDITION: Stable to PACU  HISTORY: Dana Hutchinson is a 51 y.o. female initially seen in the outpatient clinic with incidental discovery of a 2.5cm intraventricular tumor after CT was done for a fall. With the size of the lesion, surgical resection was recommended. Risks, benefits, and alternatives were reviewed in detail with the patient. After all qeustions were answered, consent was obtained.  PROCEDURE IN DETAIL: After informed consent was obtained and witnessed, the patient was brought to the operating room. After induction of general anesthesia, the patient was positioned on the operative table in the supine position, in the [place] head holder. Preoperative MRI scan was cut registered with surface markers until a satisfactory accuracy was achieved. Preoperatively, a trajectory was planned out through the right middle frontal gyrus, to allow access to the right lateral ventricle and the tumor. This point was then marked out on the skin, and curvilinear incision was marked out to allow craniotomy centered on this point. All pressure points were meticulously padded. Incision was then prepped and draped in the usual sterile fashion.  After timeout was conducted, incision was infiltrated with local anesthetic with epinephrine. Incision was then made sharply, and carried down through the galea and the periosteum. Raney clips were applied for hemostasis on the skin edges. Self-retaining retractor was then placed after subperiosteal  dissection. Stereotactic system was then again used to identify the previously plan trajectory. 2 bur holes just off the midline and one laterally was then made and connected with the craniotome. Single piece flap was then elevated. Hemostasis was secured on the epidural plane. The dura was then opened in a semilunar fashion based medially.  At this point, the stereotactic system was again used to identify the cortical entry point for the previous he plan trajectory. Bipolar electrocautery was used to coagulate the pia, and the cortex of this gyrus was opened for diameter for proximally 2.5 cm to allow placement of the Vycor tubular retractor. Using continuous stereotactic guidance, the Vicryl retractor was slowly advanced into the right lateral ventricle. Once the stylette was removed, the tumor was immediately visualized.  At this point, the microscope was draped sterilely and brought into the field, and the remainder of the case was done under the microscope using microdissection. Tumor was noted to be somewhat pink/purple in color, with more turgor then normal white matter. Initially, the posterior border of the tumor was identified. Capsule of the tumor was then coagulated, and the ultrasonic aspirator was used to debulk the posterior portion of the tumor. As this was done, dissection laterally revealed it to be significantly adherent to the lateral wall of the ventricle and the caudate. There is no clearly defined plane between the tumor and the lateral wall. Attention was then turned medially. After some more internal debulking with the ultrasonic aspirator, it did appear that the medial wall of the tumor was significantly adherent to the septum pellucidum, without clear plane. Internal debulking continued anteriorly, and in a similar way, the tumor did appear to be blending in with the wall of the head of the caudate laterally, extending into the frontal  horn. We continued to internally debulk the tumor,  until the inferior margin was identified. During dissection of the inferomedial portion of the tumor, because of its adherence to the ventricular wall, the subarachnoid space was entered, and the anterior cerebral arteries were identified.  At this point, the majority of the tumor had been debulked, although because of the lack of a clearly defined plane to the surrounding ependyma, it was clear that total resection was not possible. The choroid plexus was identified within the body of the lateral ventricle, and traced to identify the foramen of [place]. 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  The ventricular system was then irrigated with copious amounts of normal saline irrigation. There was no active bleeding identified. The tubular retractor was removed and replaced with a ventricular catheter which was tunneled subcutaneously. The dura was then reapproximated with 4-0 Nurolon stitches and covered with dura matrix. The bone flap was replaced and plated with standard titanium plates and screws. Skin flap was then closed in standard fashion with 3-0 Vicryl stitches. Skin was closed with staples. Bacitracin ointment and sterile dressings were applied. The patient was then removed from the [place] head holder, extubated, and taken to the postanesthesia care unit in stable hemodynamic condition. At the end of the case all sponge, needle, instrument, and cottonoid counts were correct.

## 2016-08-24 MED ORDER — LEVETIRACETAM 500 MG PO TABS
500.0000 mg | ORAL_TABLET | Freq: Two times a day (BID) | ORAL | Status: DC
  Administered 2016-08-24 – 2016-08-27 (×6): 500 mg via ORAL
  Filled 2016-08-24 (×6): qty 1

## 2016-08-24 NOTE — Progress Notes (Signed)
Patient ID: Dezaree Tracey, female   DOB: [birth year], 51 y.o.   MRN: [id number]  BP 111/66   Pulse 65   Temp 97.6 F (36.4 C) (Axillary)   Resp 16   Ht 5' (1.524 m)   Wt 69.6 kg (153 lb 7 oz)   LMP 08/18/2016   SpO2 97%   BMI 29.97 kg/m  Alert and following all commands Perrl, full eom Moving all extremities Speech is clear, fluent Dressing is dry, clean,

## 2016-08-25 ENCOUNTER — Inpatient Hospital Stay (HOSPITAL_COMMUNITY): Payer: BLUE CROSS/BLUE SHIELD

## 2016-08-25 MED ORDER — GADOBENATE DIMEGLUMINE 529 MG/ML IV SOLN
7.0000 mL | Freq: Once | INTRAVENOUS | Status: AC | PRN
  Administered 2016-08-25: 7 mL via INTRAVENOUS

## 2016-08-25 NOTE — Progress Notes (Signed)
Patient ID: Dana Hutchinson, female   DOB: August 18, 1965, 51 y.o.   MRN: [id number] BP 126/66   Pulse (!) 38   Temp 97.9 F (36.6 C) (Oral)   Resp 10   Ht 5' (1.524 m)   Wt 69.6 kg (153 lb 7 oz)   LMP 08/18/2016   SpO2 100%   BMI 29.97 kg/m  Alert and oriented x 4, speech is clear and fluent Moving all extremities well, no drift Perrl, full eom Dressing is clean and dry. Will clamp ventricular catheter today.

## 2016-08-26 MED ORDER — PANTOPRAZOLE SODIUM 40 MG PO TBEC
40.0000 mg | DELAYED_RELEASE_TABLET | Freq: Every day | ORAL | Status: DC
  Administered 2016-08-26: 40 mg via ORAL
  Filled 2016-08-26: qty 1

## 2016-08-26 NOTE — Progress Notes (Signed)
Pt seen and examined. No issues overnight. Minimal HA this pm.  EXAM: Temp:  [97.5 F (36.4 C)-98.4 F (36.9 C)] 98 F (36.7 C) (06/18 1600) Pulse Rate:  [39-66] 48 (06/18 1800) Resp:  [10-18] 15 (06/18 1800) BP: (95-136)/(48-83) 127/60 (06/18 1800) SpO2:  [99 %-100 %] 100 % (06/18 1800) Intake/Output      06/17 0701 - 06/18 0700 06/18 0701 - 06/19 0700   P.O. 480    I.V. (mL/kg) 300 (4.3)    IV Piggyback     Total Intake(mL/kg) 780 (11.2)    Urine (mL/kg/hr) 0 (0)    Drains 6 (0)    Stool 0 (0)    Total Output 6     Net +774          Urine Occurrence 9 x 3 x   Stool Occurrence 1 x     Awake, alert, oriented  Speech fluent CN intact Good strength throughout  LABS: Lab Results  Component Value Date   CREATININE 0.64 08/21/2016   BUN 10 08/21/2016   NA 144 08/23/2016   K 3.2 (L) 08/23/2016   CL 107 08/21/2016   CO2 25 08/21/2016   Lab Results  Component Value Date   WBC 5.2 08/21/2016   HGB 9.5 (L) 08/23/2016   HCT 28.0 (L) 08/23/2016   MCV 86.7 08/21/2016   PLT 288 08/21/2016    IMAGING: MRI reviewed, no HCP, no evidence of residual intraventricular tumor.  IMPRESSION: - 51 y.o. female POD#3 s/p resection of right lateral ventricular tumor, at neurologic baseline. EVD clamped x24 hrs without change.  PLAN: - EVD d/c'ed at bedside - Can transfer to floor tomorrow - Plan on d/c home in the next day or two.

## 2016-08-26 NOTE — Progress Notes (Signed)
Patient arrived to 5M03 from 4N. Patient alert and oriented x 4. Ambulated with standby assist only. Right frontal crani incision covered with gauze and clean and dry.  Patient reports no pain. Family at bedside. Patient and family oriented to room and phone and call-bell.

## 2016-08-27 ENCOUNTER — Encounter (HOSPITAL_COMMUNITY): Payer: Self-pay | Admitting: Neurosurgery

## 2016-08-27 MED ORDER — HYDROCODONE-ACETAMINOPHEN 5-325 MG PO TABS: 1.0000 | ORAL_TABLET | ORAL | 0 refills | Status: AC | PRN

## 2016-08-27 MED ORDER — PREDNISONE 10 MG (21) PO TBPK: ORAL_TABLET | ORAL | 0 refills | Status: AC

## 2016-08-27 MED ORDER — LEVETIRACETAM 500 MG PO TABS: 500.0000 mg | ORAL_TABLET | Freq: Two times a day (BID) | ORAL | 1 refills | Status: AC

## 2016-08-27 NOTE — Progress Notes (Signed)
Dressing removed at this time . No drainage noted. Staples intact.   Ave Filter, RN

## 2016-08-27 NOTE — Progress Notes (Signed)
Discharge instruction reviewed with patient/family. RXs given. All questions answered at this time. Pt wants to shower prior to discharge at this time. Transport home by family.   Ave Filter, RN

## 2016-08-27 NOTE — Care Management Note (Signed)
Case Management Note  Patient Details  Name: Dana Hutchinson MRN: [id number] Date of Birth: 01-Jul-1965  Subjective/Objective:     Pt s/p craniotomy for tumor resection. She is from home with family.               Action/Plan: Plan is for patient to return home when medically ready. CM following for d/c needs, physician orders.  Expected Discharge Date:                  Expected Discharge Plan:  Home/Self Care  In-House Referral:     Discharge planning Services     Post Acute Care Choice:    Choice offered to:     DME Arranged:    DME Agency:     HH Arranged:    HH Agency:     Status of Service:  In process, will continue to follow  If discussed at Long Length of Stay Meetings, dates discussed:    Additional Comments:  Pollie Friar, RN 08/27/2016, 10:45 AM

## 2016-08-27 NOTE — Discharge Summary (Addendum)
  Physician Discharge Summary  Patient ID: Dana Hutchinson MRN: [id number] DOB/AGE: 53-Dec-1967 46 y.o.  Admit date: 08/23/2016 Discharge date: 08/27/2016  Admission Diagnoses:  Brain tumor  Discharge Diagnoses:  Same Active Problems:   S/P craniotomy   Brain tumor [facility])   Discharged Condition: Stable  Hospital Course:  Dana Hutchinson is a 51 y.o. female electively admitted after right frontal craniotomy for resection of tumor. She was at neurologic baseline postop. MRI demonstrated no residual tumor. EVD was clamped without change in condition and discontinued. She was tolerating regular diet, voiding normally, ambulating independently and was discharged in stable condition on POD# 4.  Treatments: Surgery - Right frontal craniotomy, resection of tumor  Discharge Exam: Blood pressure (!) 130/59, pulse 64, temperature 98.2 F (36.8 C), temperature source Oral, resp. rate 16, height 5' (1.524 m), weight 69.6 kg (153 lb 7 oz), last menstrual period 08/18/2016, SpO2 98 %, unknown if currently breastfeeding. Awake, alert, oriented Speech fluent, appropriate CN grossly intact 5/5 BUE/BLE Wound c/d/i  Disposition: Home  Discharge Instructions    Call MD for:  redness, tenderness, or signs of infection (pain, swelling, redness, odor or green/yellow discharge around incision site)    Complete by:  As directed    Call MD for:  temperature >100.4    Complete by:  As directed    Diet - low sodium heart healthy    Complete by:  As directed    Discharge instructions    Complete by:  As directed    Walk at home as much as possible, at least 4 times / day   Increase activity slowly    Complete by:  As directed    May shower / Bathe    Complete by:  As directed    48 hours after surgery   May walk up steps    Complete by:  As directed    No dressing needed    Complete by:  As directed      Allergies as of 08/27/2016      Reactions   No Known Allergies       Medication List    TAKE  these medications   HYDROcodone-acetaminophen 5-325 MG tablet Commonly known as:  NORCO/VICODIN Take 1 tablet by mouth every 4 (four) hours as needed for moderate pain.   ibuprofen 800 MG tablet Commonly known as:  ADVIL,MOTRIN Take 1 tablet by mouth every 8 (eight) hours as needed for cramping.   levETIRAcetam 500 MG tablet Commonly known as:  KEPPRA Take 1 tablet (500 mg total) by mouth 2 (two) times daily.   multivitamin tablet Take 1 tablet by mouth daily.   predniSONE 10 MG (21) Tbpk tablet Commonly known as:  STERAPRED UNI-PAK 21 TAB Take as directed on package      Follow-up Information    Dana Lose, MD Follow up in 2 week(s).   Specialty:  Neurosurgery Contact information: [address] [place] [address] [place] [address] [phone]           Signed: Consuella Hutchinson, Dana Hutchinson 08/27/2016, 12:20 PM

## 2016-08-27 NOTE — Care Management Note (Signed)
Case Management Note  Patient Details  Name: Dana Hutchinson MRN: [id number] Date of Birth: 10/24/1965  Subjective/Objective:                    Action/Plan: Pt discharging home with self care. Per patient her daughter is going to be staying with her at home. Pt without PCP. Pt given list of family practice groups in [place] that are accepting new patients.  Patients brother to transport her home.   Expected Discharge Date:  08/27/16               Expected Discharge Plan:  Home/Self Care  In-House Referral:     Discharge planning Services     Post Acute Care Choice:    Choice offered to:     DME Arranged:    DME Agency:     HH Arranged:    HH Agency:     Status of Service:  Completed, signed off  If discussed at [employer] of Stay Meetings, dates discussed:    Additional Comments:  Pollie Friar, RN 08/27/2016, 2:20 PM

## 2016-09-10 ENCOUNTER — Encounter (HOSPITAL_COMMUNITY): Payer: Self-pay

## 2016-11-01 ENCOUNTER — Other Ambulatory Visit: Payer: Self-pay | Admitting: Neurosurgery

## 2016-11-01 DIAGNOSIS — D330 Benign neoplasm of brain, supratentorial: Secondary | ICD-10-CM

## 2016-11-12 ENCOUNTER — Telehealth: Payer: Self-pay | Admitting: Internal Medicine

## 2016-11-12 NOTE — Telephone Encounter (Signed)
Appt has been scheduled for the pt to see Dr. Mickeal Skinner on 9/7 at 11am. Pt aware to arrive 30 minutes early. Address and insurance verified. Letter mailed to the pt.

## 2016-11-15 ENCOUNTER — Encounter (INDEPENDENT_AMBULATORY_CARE_PROVIDER_SITE_OTHER): Payer: Self-pay

## 2016-11-15 ENCOUNTER — Ambulatory Visit (HOSPITAL_BASED_OUTPATIENT_CLINIC_OR_DEPARTMENT_OTHER): Payer: BLUE CROSS/BLUE SHIELD | Admitting: Internal Medicine

## 2016-11-15 ENCOUNTER — Telehealth: Payer: Self-pay | Admitting: *Deleted

## 2016-11-15 ENCOUNTER — Encounter: Payer: Self-pay | Admitting: Internal Medicine

## 2016-11-15 VITALS — BP 120/54 | HR 60 | Temp 98.2°F | Resp 18 | Ht 60.0 in | Wt 148.9 lb

## 2016-11-15 DIAGNOSIS — C719 Malignant neoplasm of brain, unspecified: Secondary | ICD-10-CM

## 2016-11-15 NOTE — Progress Notes (Signed)
[facility] at [place], [place] [address] [phone]   New Patient Evaluation  Date of Service: 11/15/16 Patient Name: Dana Hutchinson Patient MRN: [id number] Patient DOB: 1965/08/08 Provider: Ventura Sellers, MD  Identifying Statement:  Dana Hutchinson is a 51 y.o. 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    Referring Provider: Sandi Mariscal, MD [place], [place] [address]  Oncologic History: 01/26/16: Head trauma from mechanical fall, CNS imaging demonstrates central enhancing mass 08/26/16: Craniotomy, debulking surgery with Dr. Kathyrn Sheriff. 11/15/16: Initial consult at [facility].  Plan to review case with multidisciplinary tumor board  Biomarkers:  MGMT Unknown.  IDH 1/2 Wild type.  EGFR Unknown  Ki67 low   History of Present Illness:The patient's records from the referring physician were obtained and reviewed and the patient interviewed to confirm this HPI.  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  She denies any neurologic symptoms at that time, or since.  The lesion underwent craniotomy and resection by Dr. Kathyrn Sheriff on 08/27/16, which was described as less than gross total given tumor adherence to ependymal layer.  She recovered well from surgery and finished her AED course without seizure or other complaint.  She is interested in returning to work, and has upcoming MRI and visit with Dr. Kathyrn Sheriff next week.  Otherwise denies headaches, fever, chills, nausea, vomiting.   Current Outpatient Prescriptions on File Prior to Visit  Medication Sig Dispense Refill  . ibuprofen (ADVIL,MOTRIN) 800 MG tablet Take 1 tablet by mouth every 8 (eight) hours as needed for cramping.  6  . HYDROcodone-acetaminophen  (NORCO/VICODIN) 5-325 MG tablet Take 1 tablet by mouth every 4 (four) hours as needed for moderate pain. (Patient not taking: Reported on 11/15/2016) 30 tablet 0  . levETIRAcetam (KEPPRA) 500 MG tablet Take 1 tablet (500 mg total) by mouth 2 (two) times daily. (Patient not taking: Reported on 11/15/2016) 60 tablet 1  . Multiple Vitamin (MULTIVITAMIN) tablet Take 1 tablet by mouth daily.    . predniSONE (STERAPRED UNI-PAK 21 TAB) 10 MG (21) TBPK tablet Take as directed on package (Patient not taking: Reported on 11/15/2016) 21 tablet 0   No current facility-administered medications on file prior to visit.     Allergies:  Allergies  Allergen Reactions  . No Known Allergies    Past Medical History:  Past Medical History:  Diagnosis Date  . Brain tumor (benign) [facility])    Past Surgical History:  Past Surgical History:  Procedure Laterality Date  . APPLICATION OF CRANIAL NAVIGATION Right 08/23/2016   Procedure: APPLICATION OF CRANIAL NAVIGATION;  Surgeon: Consuella Lose, MD;  Location: [place];  Service: Neurosurgery;  Laterality: Right;  . CARPAL TUNNEL RELEASE Bilateral   . COLONOSCOPY    . CRANIOTOMY Right 08/23/2016   Procedure: STERIOTACTIC RIGHT FRONTAL CRANIOTOMY FOR RESECTION OF TUMOR;  Surgeon: Consuella Lose, MD;  Location: [facility];  Service: Neurosurgery;  Laterality: Right;  . TUBAL LIGATION     Social History:  Social History   Social History  . Marital status: Single    Spouse name: N/A  . Number of children: N/A  . Years of education: N/A   Occupational History  . Not on file.   Social History Main Topics  .  Smoking status: Never Smoker  . Smokeless tobacco: Never Used  . Alcohol use No  . Drug use: No  . Sexual activity: Not on file   Other Topics Concern  . Not on file   Social History Narrative  . No narrative on file   Family History: History reviewed. No pertinent family history.  Review of Systems: Constitutional: Denies fevers, chills or abnormal  weight loss Eyes: Denies blurriness of vision Ears, nose, mouth, throat, and face: Denies mucositis or sore throat Respiratory: Denies cough, dyspnea or wheezes Cardiovascular: Denies palpitation, chest discomfort or lower extremity swelling Gastrointestinal:  Denies nausea, constipation, diarrhea GU: Denies dysuria or incontinence Skin: Denies abnormal skin rashes Neurological: Per HPI Musculoskeletal: Denies joint pain, back or neck discomfort. No decrease in ROM Behavioral/Psych: Denies anxiety, disturbance in thought content, and mood instability   Physical Exam: Vitals:   11/15/16 1117  BP: (!) 120/54  Pulse: 60  Resp: 18  Temp: 98.2 F (36.8 C)  SpO2: 100%   KPS: 100. General: Alert, cooperative, pleasant, in no acute distress Head: Craniotomy scar noted, dry and intact. EENT: No conjunctival injection or scleral icterus. Oral mucosa moist Lungs: Resp effort normal Cardiac: Regular rate and rhythm Abdomen: Soft, non-distended abdomen Skin: No rashes cyanosis or petechiae. Extremities: No clubbing or edema  Neurologic Exam: Mental Status: Awake, alert, attentive to examiner. Oriented to self and environment. Language is fluent with intact comprehension.  Cranial Nerves: Visual acuity is grossly normal. Visual fields are full. Extra-ocular movements intact. No ptosis. Face is symmetric, tongue midline. Motor: Tone and bulk are normal. Power is full in both arms and legs. Reflexes are symmetric, no pathologic reflexes present. Intact finger to nose bilaterally Sensory: Intact to light touch and temperature Gait: Normal and tandem gait is normal.   Labs: I have reviewed the data as listed    Component Value Date/Time   NA 144 08/23/2016 0832   K 3.2 (L) 08/23/2016 0832   CL 107 08/21/2016 1338   CO2 25 08/21/2016 1338   GLUCOSE 84 08/21/2016 1338   BUN 10 08/21/2016 1338   CREATININE 0.64 08/21/2016 1338   CALCIUM 8.8 (L) 08/21/2016 1338   GFRNONAA >60  08/21/2016 1338   GFRAA >60 08/21/2016 1338   Lab Results  Component Value Date   WBC 5.2 08/21/2016   HGB 9.5 (L) 08/23/2016   HCT 28.0 (L) 08/23/2016   MCV 86.7 08/21/2016   PLT 288 08/21/2016    Imaging: CLINICAL DATA:  Status post brain tumor resection  EXAM: MRI HEAD WITHOUT AND WITH CONTRAST  TECHNIQUE: Multiplanar, multiecho pulse sequences of the brain and surrounding structures were obtained without and with intravenous contrast.  CONTRAST:  30m MULTIHANCE GADOBENATE DIMEGLUMINE 529 MG/ML IV SOLN  COMPARISON:  Brain MRI 01/18/2016  FINDINGS: Brain: Status post resection of intraventricular tumor located near the right foramen of Monro. Low signal material at the resection bed and within the right frontal white matter likely represents postoperative blood products. There is no residual contrast-enhancing mass within the ventricle. There is no focal diffusion restriction to indicate acute infarct. Brain volume is normal for age without age-advanced or lobar predominant atrophy. There is enhancement of the dura overlying the right convexity that is likely postoperative. There is a right frontal approach ventriculostomy catheter with tip projecting over the left caudate head.  Vascular: Major intracranial arterial and venous sinus flow voids are preserved.  Skull and upper cervical spine: Status post right frontal craniotomy.  Sinuses/Orbits: No fluid  levels or advanced mucosal thickening. No mastoid effusion. Normal orbits.  IMPRESSION: 1. Status post resection of intraventricular tumor located near the right foramen of Monro without evidence of residual tumor. 2. Postoperative blood products at the resection site and postoperative dural enhancement along the right convexity. 3. Tip of the EVD catheter projects over the left caudate head, which is likely artifactual. Ensure CSF return from drain to confirm intraventricular  position.   Electronically Signed   By: Ulyses Jarred M.D.   On: 08/25/2016 03:53  I have personally reviewed the radiological images as listed.  My interpretation, in the context of the patient's clinical presentation, is expected post-operative changes.   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   We described the biology, physiology, anatomy and treatment options for adult pilocytic astrocytoma, and for her case specifically.  Although published datasets are often sub-par for rare tumors such as this, we know there is a likelihood of regrowth/recurrence in the future given her subtotal resection.  Occasionally adult PA can behave very aggressively, mimicing high grade gliomas.  Even in absence of upfront therapy, it is essential that the tumor is followed with serial imaging.  Given the location it is unlikely that tumor progression would be tipped of by clinical symptoms.    We will present her case in multidisciplinary tumor board following her next MRI on 9/17 to discuss treatment options.  Any treatment opinions, apart from watching with imaging alone, will then be discussed with the patient over the phone prior to her next visit.    Screening for potential clinical trials was performed and discussed using eligibility criteria for active protocols at [facility], loco-regional tertiary centers, as well as national database available on [email or web address].com.    The patient is not a candidate for a research protocol at this time due to lack of studies for rare tumors.   We recommend that she follows up in clinic in 3 months with an MRI brain for review.    We spent twenty additional minutes teaching regarding the natural history, biology, and historical experience in the treatment of brain tumors. We then discussed in detail the current recommendations for therapy  focusing on the mode of administration, mechanism of action, anticipated toxicities, and quality of life issues associated with this plan. We also provided teaching sheets for the patient to take home as an additional resource.  All questions were answered. The patient knows to call the clinic with any problems, questions or concerns. No barriers to learning were detected.  I spent 50 minutes counseling the patient face to face. The total time spent in the appointment was 60 minutes and more than 50% was on counseling and review of test results   Ventura Sellers, MD Medical Director of Neuro-Oncology [facility] [place] 11/15/16 11:26 AM

## 2016-11-15 NOTE — Telephone Encounter (Signed)
Called patient to advise of MRI that was scheduled for 02/21/2017 @ 9:00; needs to arrive at 8:45.  No Answer; Left Message

## 2016-11-29 ENCOUNTER — Ambulatory Visit
Admission: RE | Admit: 2016-11-29 | Discharge: 2016-11-29 | Disposition: A | Discharge status: Home / Self Care | Payer: BLUE CROSS/BLUE SHIELD | Source: Ambulatory Visit | Attending: Neurosurgery | Admitting: Neurosurgery

## 2016-11-29 DIAGNOSIS — D330 Benign neoplasm of brain, supratentorial: Secondary | ICD-10-CM

## 2016-11-29 MED ORDER — GADOBENATE DIMEGLUMINE 529 MG/ML IV SOLN
14.0000 mL | Freq: Once | INTRAVENOUS | Status: AC | PRN
  Administered 2016-11-29: 14 mL via INTRAVENOUS

## 2017-02-21 ENCOUNTER — Other Ambulatory Visit: Payer: Self-pay | Admitting: *Deleted

## 2017-02-21 ENCOUNTER — Telehealth: Payer: Self-pay | Admitting: *Deleted

## 2017-02-21 ENCOUNTER — Ambulatory Visit (HOSPITAL_COMMUNITY): Admission: RE | Admit: 2017-02-21 | Payer: BLUE CROSS/BLUE SHIELD | Source: Ambulatory Visit

## 2017-02-21 ENCOUNTER — Ambulatory Visit: Payer: BLUE CROSS/BLUE SHIELD | Admitting: Internal Medicine

## 2017-02-21 DIAGNOSIS — C719 Malignant neoplasm of brain, unspecified: Secondary | ICD-10-CM

## 2017-02-21 NOTE — Telephone Encounter (Signed)
Patient was a no show for her MRI and appointment with Dr. Mickeal Skinner.  Called patient to advise patient of missed appointments.  No explanation or reasoning given.  Asked to reschedule to a Thursday as late in day as possible.  Rescheduled MRI and dr appt and notified patient.

## 2017-02-24 ENCOUNTER — Ambulatory Visit: Payer: BLUE CROSS/BLUE SHIELD | Admitting: Internal Medicine

## 2017-03-20 ENCOUNTER — Ambulatory Visit (HOSPITAL_COMMUNITY): Admission: RE | Admit: 2017-03-20 | Payer: BLUE CROSS/BLUE SHIELD | Source: Ambulatory Visit

## 2017-03-25 ENCOUNTER — Ambulatory Visit (HOSPITAL_COMMUNITY): Admission: RE | Admit: 2017-03-25 | Payer: BLUE CROSS/BLUE SHIELD | Source: Ambulatory Visit

## 2017-03-25 ENCOUNTER — Ambulatory Visit (HOSPITAL_COMMUNITY)
Admission: RE | Admit: 2017-03-25 | Discharge: 2017-03-25 | Disposition: A | Discharge status: Home / Self Care | Payer: BLUE CROSS/BLUE SHIELD | Source: Ambulatory Visit | Attending: Internal Medicine | Admitting: Internal Medicine

## 2017-03-25 ENCOUNTER — Other Ambulatory Visit: Payer: Self-pay | Admitting: *Deleted

## 2017-03-25 DIAGNOSIS — Z9889 Other specified postprocedural states: Secondary | ICD-10-CM | POA: Insufficient documentation

## 2017-03-25 DIAGNOSIS — C719 Malignant neoplasm of brain, unspecified: Secondary | ICD-10-CM | POA: Diagnosis not present

## 2017-03-25 MED ORDER — GADOBENATE DIMEGLUMINE 529 MG/ML IV SOLN
15.0000 mL | Freq: Once | INTRAVENOUS | Status: AC | PRN
  Administered 2017-03-25: 13 mL via INTRAVENOUS

## 2017-03-27 ENCOUNTER — Ambulatory Visit: Payer: BLUE CROSS/BLUE SHIELD | Admitting: Internal Medicine

## 2017-03-31 ENCOUNTER — Ambulatory Visit: Payer: BLUE CROSS/BLUE SHIELD | Admitting: Internal Medicine

## 2017-04-02 ENCOUNTER — Telehealth: Payer: Self-pay | Admitting: Internal Medicine

## 2017-04-02 NOTE — Telephone Encounter (Signed)
Tried calling patient regarding reschedule to 1/28

## 2017-04-07 ENCOUNTER — Ambulatory Visit: Payer: BLUE CROSS/BLUE SHIELD | Admitting: Internal Medicine

## 2017-05-22 ENCOUNTER — Telehealth: Payer: Self-pay | Admitting: *Deleted

## 2017-05-22 NOTE — Telephone Encounter (Signed)
Called patient to reschedule missed appointment with Dr Mickeal Skinner.  Lm for returned call to reschedule.  This appt would be to review the last scan and results.

## 2017-07-24 ENCOUNTER — Other Ambulatory Visit: Payer: Self-pay

## 2017-07-24 ENCOUNTER — Inpatient Hospital Stay: Payer: Self-pay | Attending: Internal Medicine | Admitting: Internal Medicine

## 2017-07-24 ENCOUNTER — Telehealth: Payer: Self-pay | Admitting: Internal Medicine

## 2017-07-24 ENCOUNTER — Encounter: Payer: Self-pay | Admitting: Internal Medicine

## 2017-07-24 VITALS — BP 123/73 | HR 74 | Temp 98.0°F | Resp 18 | Ht 60.0 in | Wt 153.1 lb

## 2017-07-24 DIAGNOSIS — Z79899 Other long term (current) drug therapy: Secondary | ICD-10-CM | POA: Insufficient documentation

## 2017-07-24 DIAGNOSIS — Z85841 Personal history of malignant neoplasm of brain: Secondary | ICD-10-CM | POA: Insufficient documentation

## 2017-07-24 DIAGNOSIS — C719 Malignant neoplasm of brain, unspecified: Secondary | ICD-10-CM

## 2017-07-24 NOTE — Progress Notes (Signed)
[facility] at [place], [place] [address] [phone]   Interval Evaluation  Date of Service: 07/24/17 Patient Name: Dana Hutchinson Patient MRN: [id number] Patient DOB: 1965/04/16 Provider: Ventura Sellers, MD  Identifying Statement:  Dana Hutchinson is a 52 y.o. female with pilocytic astrocytoma   Oncologic History: 01/26/16: Head trauma from mechanical fall, CNS imaging demonstrates central enhancing mass 08/26/16: Craniotomy, debulking surgery with Dr. Kathyrn Sheriff. 11/15/16: Initial consult at [facility].  Plan to review case with multidisciplinary tumor board  Biomarkers:  MGMT Unknown.  IDH 1/2 Wild type.  EGFR Unknown  Ki67 low   Interval History:  Dana Hutchinson presents for follow up after recent MRI of the brain.  She describes no new or progressive neurologic deficits.  She is back working full time. Otherwise denies headaches, fever, chills, nausea, vomiting.   Current Outpatient Medications on File Prior to Visit  Medication Sig Dispense Refill  . cholecalciferol (VITAMIN D) 1000 units tablet Take 1,000 Units by mouth daily.    Marland Kitchen HYDROcodone-acetaminophen (NORCO/VICODIN) 5-325 MG tablet Take 1 tablet by mouth every 4 (four) hours as needed for moderate pain. (Patient not taking: Reported on 11/15/2016) 30 tablet 0  . ibuprofen (ADVIL,MOTRIN) 800 MG tablet Take 1 tablet by mouth every 8 (eight) hours as needed for cramping.  6  . levETIRAcetam (KEPPRA) 500 MG tablet Take 1 tablet (500 mg total) by mouth 2 (two) times daily. (Patient not taking: Reported on 11/15/2016) 60 tablet 1  . Multiple Vitamin (MULTIVITAMIN) tablet Take 1 tablet by mouth daily.    . predniSONE (STERAPRED UNI-PAK 21 TAB) 10 MG (21) TBPK tablet Take as directed on package (Patient not taking: Reported on 11/15/2016) 21 tablet 0   No current facility-administered medications on file prior to visit.     Allergies:  Allergies  Allergen Reactions  . No Known Allergies     Past Medical History:  Past Medical History:  Diagnosis Date  . Brain tumor (benign) [facility])    Past Surgical History:  Past Surgical History:  Procedure Laterality Date  . APPLICATION OF CRANIAL NAVIGATION Right 08/23/2016   Procedure: APPLICATION OF CRANIAL NAVIGATION;  Surgeon: Consuella Lose, MD;  Location: [facility];  Service: Neurosurgery;  Laterality: Right;  . CARPAL TUNNEL RELEASE Bilateral   . COLONOSCOPY    . CRANIOTOMY Right 08/23/2016   Procedure: STERIOTACTIC RIGHT FRONTAL CRANIOTOMY FOR RESECTION OF TUMOR;  Surgeon: Consuella Lose, MD;  Location: [facility];  Service: Neurosurgery;  Laterality: Right;  . TUBAL LIGATION     Social History:  Social History   Socioeconomic History  . Marital status: Single    Spouse name: Not on file  . Number of children: Not on file  . Years of education: Not on file  . Highest education level: Not on file  Occupational History  . Not on file  Social Needs  . Financial resource strain: Not on file  . Food insecurity:    Worry: Not on file    Inability: Not on file  . Transportation needs:    Medical: Not on file    Non-medical: Not on file  Tobacco Use  . Smoking status: Never Smoker  . Smokeless tobacco: Never Used  Substance and Sexual Activity  . Alcohol use: No  . Drug use: No  . Sexual activity: Not on file  Lifestyle  . Physical activity:    Days per week: Not on file    Minutes per session:  Not on file  . Stress: Not on file  Relationships  . Social connections:    Talks on phone: Not on file    Gets together: Not on file    Attends religious service: Not on file    Active member of club or organization: Not on file    Attends meetings of clubs or organizations: Not on file    Relationship status: Not on file  . Intimate partner violence:    Fear of current or ex partner: Not on file    Emotionally abused: Not on file    Physically abused: Not on file    Forced sexual activity: Not on file  Other Topics  Concern  . Not on file  Social History Narrative  . Not on file   Family History: History reviewed. No pertinent family history.  Review of Systems: Constitutional: Denies fevers, chills or abnormal weight loss Eyes: Denies blurriness of vision Ears, nose, mouth, throat, and face: Denies mucositis or sore throat Respiratory: Denies cough, dyspnea or wheezes Cardiovascular: Denies palpitation, chest discomfort or lower extremity swelling Gastrointestinal:  Denies nausea, constipation, diarrhea GU: Denies dysuria or incontinence Skin: Denies abnormal skin rashes Neurological: Per HPI Musculoskeletal: Denies joint pain, back or neck discomfort. No decrease in ROM Behavioral/Psych: Denies anxiety, disturbance in thought content, and mood instability   Physical Exam: Vitals:   07/24/17 1242  BP: 123/73  Pulse: 74  Resp: 18  Temp: 98 F (36.7 C)  SpO2: 100%   KPS: 100. General: Alert, cooperative, pleasant, in no acute distress Head: Normal EENT: No conjunctival injection or scleral icterus. Oral mucosa moist Lungs: Resp effort normal Cardiac: Regular rate and rhythm Abdomen: Soft, non-distended abdomen Skin: No rashes cyanosis or petechiae. Extremities: No clubbing or edema  Neurologic Exam: Mental Status: Awake, alert, attentive to examiner. Oriented to self and environment. Language is fluent with intact comprehension.  Cranial Nerves: Visual acuity is grossly normal. Visual fields are full. Extra-ocular movements intact. No ptosis. Face is symmetric, tongue midline. Motor: Tone and bulk are normal. Power is full in both arms and legs. Reflexes are symmetric, no pathologic reflexes present. Intact finger to nose bilaterally Sensory: Intact to light touch and temperature Gait: Normal and tandem gait is normal.   Labs: I have reviewed the data as listed    Component Value Date/Time   NA 144 08/23/2016 0832   K 3.2 (L) 08/23/2016 0832   CL 107 08/21/2016 1338   CO2  25 08/21/2016 1338   GLUCOSE 84 08/21/2016 1338   BUN 10 08/21/2016 1338   CREATININE 0.64 08/21/2016 1338   CALCIUM 8.8 (L) 08/21/2016 1338   GFRNONAA >60 08/21/2016 1338   GFRAA >60 08/21/2016 1338   Lab Results  Component Value Date   WBC 5.2 08/21/2016   HGB 9.5 (L) 08/23/2016   HCT 28.0 (L) 08/23/2016   MCV 86.7 08/21/2016   PLT 288 08/21/2016    Imaging:  [place] Clinician Interpretation: I have personally reviewed the CNS images as listed.  My interpretation, in the context of the patient's clinical presentation, is stable disease   CLINICAL DATA:  Postop resection of right ventricular tumor, pilocytic astrocytoma WHO grade 1. Resection 08/23/2016  EXAM: MRI HEAD WITHOUT AND WITH CONTRAST  TECHNIQUE: Multiplanar, multiecho pulse sequences of the brain and surrounding structures were obtained without and with intravenous contrast.  CONTRAST:  77m MULTIHANCE GADOBENATE DIMEGLUMINE 529 MG/ML IV SOLN  COMPARISON:  MRI head 01/18/2016, 08/25/2016, 11/29/2016  FINDINGS: Brain: Right frontal  craniotomy for resection of right ventricular tumor. Encephalomalacia right frontal lobe contains chronic blood products. This extends down to the right frontal horn. Interval improvement in enhancement in the region of the right frontal corpus callosum compared with the prior study. This is outside the ventricle and likely is postoperative enhancement. No intraventricular mass or abnormal enhancement is identified. Ventricle size remains normal. No acute infarct or fluid collection  Vascular: Normal arterial flow void  Skull and upper cervical spine: No acute abnormality. Right frontal craniotomy  Sinuses/Orbits: Air-fluid level right maxillary sinus with mucosal edema in the maxillary and ethmoid sinuses bilaterally. 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 No evidence of recurrent tumor. Small amount of  enhancement in the corpus callosum on the right at the site of surgical resection shows interval improvement and likely is postoperative enhancement.   Electronically Signed   By: Franchot Gallo M.D.   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 Nugent is clinically and radiographically stable today.  Her MRI was performed in January, so based on recommended 6 month interval next scan should be performed in July.    She will follow up with [place] after next MRI.  We appreciate the opportunity to participate in the care of Donyale Falcon.   All questions were answered. The patient knows to call the clinic with any problems, questions or concerns. No barriers to learning were detected.  The total time spent in the appointment was 25 minutes and more than 50% was on counseling and review of test results   Ventura Sellers, MD Medical Director of Neuro-Oncology [facility] [place] 07/24/17 12:34 PM

## 2017-07-24 NOTE — Telephone Encounter (Signed)
Appointments scheduled AVS/Calendar printed per 5/16 los °

## 2017-09-19 ENCOUNTER — Ambulatory Visit (HOSPITAL_COMMUNITY): Admission: RE | Admit: 2017-09-19 | Payer: Self-pay | Source: Ambulatory Visit

## 2017-09-23 ENCOUNTER — Telehealth: Payer: Self-pay | Admitting: *Deleted

## 2017-09-23 ENCOUNTER — Ambulatory Visit: Payer: Self-pay | Admitting: Internal Medicine

## 2017-09-23 NOTE — Telephone Encounter (Signed)
Patient rescheduled her MRI and now her doctors appt needs to be rescheduled.  Called lm for returned call to reschedule to after her MRI is completed.  Pending call back

## 2017-09-25 ENCOUNTER — Ambulatory Visit (HOSPITAL_COMMUNITY)
Admission: RE | Admit: 2017-09-25 | Discharge: 2017-09-25 | Disposition: A | Discharge status: Home / Self Care | Payer: Self-pay | Source: Ambulatory Visit | Attending: Internal Medicine | Admitting: Internal Medicine

## 2017-09-25 DIAGNOSIS — Z9889 Other specified postprocedural states: Secondary | ICD-10-CM | POA: Insufficient documentation

## 2017-09-25 DIAGNOSIS — C719 Malignant neoplasm of brain, unspecified: Secondary | ICD-10-CM

## 2017-09-25 MED ORDER — GADOBENATE DIMEGLUMINE 529 MG/ML IV SOLN
15.0000 mL | Freq: Once | INTRAVENOUS | Status: AC | PRN
  Administered 2017-09-25: 14 mL via INTRAVENOUS

## 2017-10-16 ENCOUNTER — Telehealth: Payer: Self-pay | Admitting: *Deleted

## 2017-10-16 NOTE — Telephone Encounter (Signed)
Patient called lm to reschedule appt, attempted to call to reschedule lm for returned call

## 2017-10-31 ENCOUNTER — Telehealth: Payer: Self-pay | Admitting: *Deleted

## 2017-10-31 NOTE — Telephone Encounter (Signed)
Received voice mail message from patient stating,"I need to make an appointment for next week with Dr. Mickeal Skinner. Return number is [phone]. The best time to reach me is between 10:00 am and 1:00 pm."

## 2017-11-03 ENCOUNTER — Telehealth: Payer: Self-pay | Admitting: Internal Medicine

## 2017-11-03 NOTE — Telephone Encounter (Signed)
Scheduled appt per 8/23 sch message - pt is aware of appt date and time.

## 2017-11-06 ENCOUNTER — Inpatient Hospital Stay: Payer: Self-pay | Attending: Internal Medicine | Admitting: Internal Medicine

## 2017-11-13 ENCOUNTER — Telehealth: Payer: Self-pay | Admitting: *Deleted

## 2017-11-13 ENCOUNTER — Telehealth: Payer: Self-pay | Admitting: Internal Medicine

## 2017-11-13 NOTE — Telephone Encounter (Signed)
FYI  "Missed last week's appointment with Dr. Mickeal Skinner.  Trying to reschedule."  "No I have not tried the scheduling option." Denies further needs or questions at this time.  Transferred to scheduling extension [phone].

## 2017-11-13 NOTE — Telephone Encounter (Signed)
Patient left vmail.  Called patient back regarding appt needed for Dr. Mickeal Skinner. Made appt for 9/9 @ 9:40 AM.

## 2017-11-17 ENCOUNTER — Inpatient Hospital Stay: Payer: Self-pay | Attending: Internal Medicine | Admitting: Internal Medicine

## 2017-11-25 ENCOUNTER — Telehealth: Payer: Self-pay | Admitting: Internal Medicine

## 2017-11-25 NOTE — Telephone Encounter (Signed)
LMVM with date time of appt that was r/s as she requested per 9/17 vm

## 2017-12-03 ENCOUNTER — Inpatient Hospital Stay: Payer: Self-pay | Admitting: Internal Medicine

## 2018-01-19 ENCOUNTER — Telehealth: Payer: Self-pay

## 2018-01-19 ENCOUNTER — Other Ambulatory Visit: Payer: Self-pay

## 2018-01-19 ENCOUNTER — Encounter: Payer: Self-pay | Admitting: Internal Medicine

## 2018-01-19 ENCOUNTER — Inpatient Hospital Stay: Payer: Self-pay | Attending: Internal Medicine | Admitting: Internal Medicine

## 2018-01-19 VITALS — BP 131/61 | HR 62 | Temp 98.3°F | Resp 17 | Ht 60.0 in | Wt 149.9 lb

## 2018-01-19 DIAGNOSIS — Z79899 Other long term (current) drug therapy: Secondary | ICD-10-CM | POA: Insufficient documentation

## 2018-01-19 DIAGNOSIS — C719 Malignant neoplasm of brain, unspecified: Secondary | ICD-10-CM | POA: Insufficient documentation

## 2018-01-19 MED ORDER — IBUPROFEN 800 MG PO TABS: 800.0000 mg | ORAL_TABLET | Freq: Three times a day (TID) | ORAL | 6 refills | Status: AC | PRN

## 2018-01-19 NOTE — Progress Notes (Signed)
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 Kathyrn Sheriff. 11/15/16: Initial consult at [facility].  Plan to review case with multidisciplinary tumor board  Biomarkers:  MGMT Unknown.  IDH 1/2 Wild type.  EGFR Unknown  Ki67 low   Interval History:  Dana Hutchinson presents for follow up after recent MRI of the brain.  She continues to describe no new or progressive neurologic deficits.  She is working full time. Otherwise denies headaches, fever, chills, nausea, vomiting.   Current Outpatient Medications on File Prior to Visit  Medication Sig Dispense Refill  . cholecalciferol (VITAMIN D) 1000 units tablet Take 1,000 Units by mouth daily.    . ciclopirox (PENLAC) 8 % solution Apply 1 application topically daily. For 30 days  2  . HYDROcodone-acetaminophen (NORCO/VICODIN) 5-325 MG tablet Take 1 tablet by mouth every 4 (four) hours as needed for moderate pain. (Patient not taking: Reported on 11/15/2016) 30 tablet 0  . ibuprofen (ADVIL,MOTRIN) 800 MG tablet Take 1 tablet by mouth every 8 (eight) hours as needed for cramping.  6  . levETIRAcetam (KEPPRA) 500 MG tablet Take 1 tablet (500 mg total) by mouth 2 (two) times daily. (Patient not taking: Reported on 11/15/2016) 60 tablet 1  . Multiple Vitamin (MULTIVITAMIN) tablet Take 1 tablet by mouth daily.    . predniSONE (STERAPRED UNI-PAK 21 TAB) 10 MG (21) TBPK tablet Take as directed on package (Patient not taking: Reported on 11/15/2016) 21 tablet 0   No current facility-administered medications  on file prior to visit.     Allergies:  Allergies  Allergen Reactions  . No Known Allergies    Past Medical History:  Past Medical History:  Diagnosis Date  . Brain tumor (benign) [facility])    Past Surgical History:  Past Surgical History:  Procedure Laterality Date  . APPLICATION OF CRANIAL NAVIGATION Right 08/23/2016   Procedure: APPLICATION OF CRANIAL NAVIGATION;  Surgeon: Consuella Lose, MD;  Location: [place];  Service: Neurosurgery;  Laterality: Right;  . CARPAL TUNNEL RELEASE Bilateral   . COLONOSCOPY    . CRANIOTOMY Right 08/23/2016   Procedure: STERIOTACTIC RIGHT FRONTAL CRANIOTOMY FOR RESECTION OF TUMOR;  Surgeon: Consuella Lose, MD;  Location: [place];  Service: Neurosurgery;  Laterality: Right;  . TUBAL LIGATION     Social History:  Social History   Socioeconomic History  . Marital status: Single    Spouse name: Not on file  . Number of children: Not on file  . Years of education: Not on file  . Highest education level: Not on file  Occupational History  . Not on file  Social Needs  . Financial resource strain: Not on file  . Food insecurity:    Worry: Not on file    Inability: Not on file  . Transportation needs:    Medical: Not on file    Non-medical: Not on file  Tobacco Use  . Smoking status: Never Smoker  . Smokeless tobacco: Never Used  Substance and Sexual Activity  . Alcohol use: No  . Drug use: No  . Sexual activity: Not on file  Lifestyle  .  Physical activity:    Days per week: Not on file    Minutes per session: Not on file  . Stress: Not on file  Relationships  . Social connections:    Talks on phone: Not on file    Gets together: Not on file    Attends religious service: Not on file    Active member of club or organization: Not on file    Attends meetings of clubs or organizations: Not on file    Relationship status: Not on file  . Intimate partner violence:    Fear of current or ex partner: Not on file    Emotionally abused: Not  on file    Physically abused: Not on file    Forced sexual activity: Not on file  Other Topics Concern  . Not on file  Social History Narrative  . Not on file   Family History: No family history on file.  Review of Systems: Constitutional: Denies fevers, chills or abnormal weight loss Eyes: Denies blurriness of vision Ears, nose, mouth, throat, and face: Denies mucositis or sore throat Respiratory: Denies cough, dyspnea or wheezes Cardiovascular: Denies palpitation, chest discomfort or lower extremity swelling Gastrointestinal:  Denies nausea, constipation, diarrhea GU: Denies dysuria or incontinence Skin: Denies abnormal skin rashes Neurological: Per HPI Musculoskeletal: Denies joint pain, back or neck discomfort. No decrease in ROM Behavioral/Psych: Denies anxiety, disturbance in thought content, and mood instability   Physical Exam: Vitals:   01/19/18 1151  BP: 131/61  Pulse: 62  Resp: 17  Temp: 98.3 F (36.8 C)  SpO2: 100%   KPS: 100. General: Alert, cooperative, pleasant, in no acute distress Head: Normal EENT: No conjunctival injection or scleral icterus. Oral mucosa moist Lungs: Resp effort normal Cardiac: Regular rate and rhythm Abdomen: Soft, non-distended abdomen Skin: No rashes cyanosis or petechiae. Extremities: No clubbing or edema  Neurologic Exam: Mental Status: Awake, alert, attentive to examiner. Oriented to self and environment. Language is fluent with intact comprehension.  Cranial Nerves: Visual acuity is grossly normal. Visual fields are full. Extra-ocular movements intact. No ptosis. Face is symmetric, tongue midline. Motor: Tone and bulk are normal. Power is full in both arms and legs. Reflexes are symmetric, no pathologic reflexes present. Intact finger to nose bilaterally Sensory: Intact to light touch and temperature Gait: Normal and tandem gait is normal.   Labs: I have reviewed the data as listed    Component Value Date/Time   NA 144  08/23/2016 0832   K 3.2 (L) 08/23/2016 0832   CL 107 08/21/2016 1338   CO2 25 08/21/2016 1338   GLUCOSE 84 08/21/2016 1338   BUN 10 08/21/2016 1338   CREATININE 0.64 08/21/2016 1338   CALCIUM 8.8 (L) 08/21/2016 1338   GFRNONAA >60 08/21/2016 1338   GFRAA >60 08/21/2016 1338   Lab Results  Component Value Date   WBC 5.2 08/21/2016   HGB 9.5 (L) 08/23/2016   HCT 28.0 (L) 08/23/2016   MCV 86.7 08/21/2016   PLT 288 08/21/2016    Imaging:  [place] Clinician Interpretation: I have personally reviewed the CNS images as listed.  My interpretation, in the context of the patient's clinical presentation, is stable disease  CLINICAL DATA:  History of intraventricular pilocytic astrocytoma resection.  EXAM: MRI HEAD WITHOUT AND WITH CONTRAST  TECHNIQUE: Multiplanar, multiecho pulse sequences of the brain and surrounding structures were obtained without and with intravenous contrast.  CONTRAST:  67m MULTIHANCE GADOBENATE DIMEGLUMINE 529 MG/ML IV SOLN  COMPARISON:  03/25/2017  FINDINGS: Brain: Sequelae of right frontal craniotomy for intraventricular tumor resection are again identified. Encephalomalacia and gliosis are again seen along the operative tract in the right frontal lobe with associated chronic blood products. Minimal curvilinear enhancement along the operative tract at the margin of the lateral ventricle has decreased and is likely postoperative. No recurrent mass is identified. The ventricles are unchanged in size.  There is no evidence of acute infarct, midline shift, or extra-axial fluid collection. A single subcentimeter focus of T2 hyperintensity in the left frontal white matter is unchanged and nonspecific.  Vascular: Major intracranial vascular flow voids are preserved.  Skull and upper cervical spine: Unremarkable bone marrow signal.  Sinuses/Orbits: Unremarkable orbits. Paranasal sinuses and mastoid air cells are clear.  Other: Mildly prominent  posterior nasopharyngeal/adenoidal soft tissues for age, unchanged and without suspicious focal lesion.  IMPRESSION: Postoperative changes without evidence of recurrent tumor.   Electronically Signed   By: Logan Bores M.D.   On: 09/25/2017 17:23   Assessment/Plan Pilocytic astrocytoma ([place])  Ms. Wojtas is clinically and radiographically stable today.  She should return to clinic in 9 months with an MRI for evaluation.  Can take ibuprofen 832m q8 PRN for severe headaches as prior.  We appreciate the opportunity to participate in the care of JMeigan Pates   All questions were answered. The patient knows to call the clinic with any problems, questions or concerns. No barriers to learning were detected.  The total time spent in the appointment was 25 minutes and more than 50% was on counseling and review of test results   ZVentura Sellers MD Medical Director of [facility] [place]11/11/19 11:25 AM

## 2018-01-19 NOTE — Telephone Encounter (Signed)
Printed avs and clender of upcoming appointment. Per 11/11 los

## 2018-01-20 ENCOUNTER — Other Ambulatory Visit: Payer: Self-pay | Admitting: *Deleted

## 2018-01-20 DIAGNOSIS — C719 Malignant neoplasm of brain, unspecified: Secondary | ICD-10-CM

## 2018-01-22 ENCOUNTER — Other Ambulatory Visit: Payer: Self-pay | Admitting: *Deleted

## 2018-09-29 ENCOUNTER — Telehealth: Payer: Self-pay | Admitting: *Deleted

## 2018-09-29 NOTE — Telephone Encounter (Signed)
Called patient to discuss getting her MRI scheduled.  Left message to relay phone for Central Sched[facility]. She needs to call them and schedule before she is seen by Dr Mickeal Skinner in August.  LM Pending returned call.

## 2018-10-14 ENCOUNTER — Telehealth: Payer: Self-pay | Admitting: *Deleted

## 2018-10-14 NOTE — Telephone Encounter (Signed)
Patient was called twice to schedule MRI before her upcoming appointment with Dr. Mickeal Skinner.  She has not returned any calls.  Letter with instructions on how to schedule MRI were also mailed to her home.    Called today to advise we are canceling her appointment until her gets her MRI scheduled.  LM pending call back.

## 2018-10-19 ENCOUNTER — Inpatient Hospital Stay: Payer: Self-pay | Admitting: Internal Medicine

## 2020-03-12 IMAGING — MR MR HEAD WO/W CM
10 of 13 series · 33 of 48 positions shown · IV contrast (multihance)
Comparison: 03/25/2017

CLINICAL DATA: History of intraventricular pilocytic astrocytoma
resection.

EXAM:
MRI HEAD WITHOUT AND WITH CONTRAST
TECHNIQUE: Multiplanar, multiecho pulse sequences of the brain and surrounding
structures were obtained without and with intravenous contrast.
CONTRAST:  14mL MULTIHANCE GADOBENATE DIMEGLUMINE 529 MG/ML IV SOLN

[Series 3: DWI · axial · 3.0mm · 1.09mm/px · z∈[-82,+64]mm · 8 of 102 slices shown (1 of 4)]
[im 1/102]
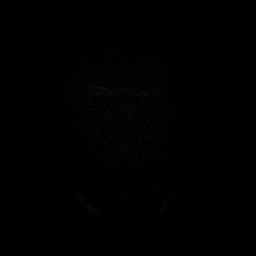
[im 12/102]
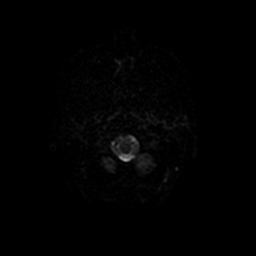
[im 34/102]
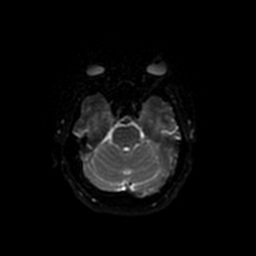
[im 45/102]
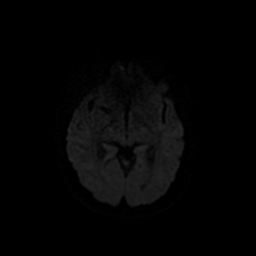
[im 57/102]
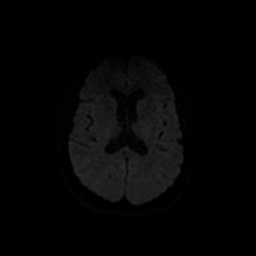
[im 68/102]
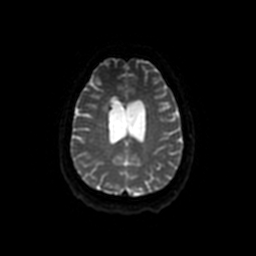
[im 90/102]
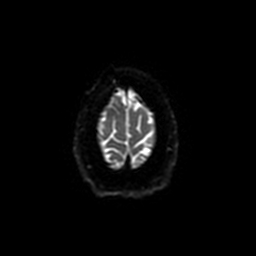
[im 102/102]
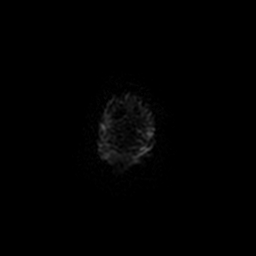

[Series 4: T1 · sagittal · 5.0mm · 0.47mm/px · 2 of 24 slices shown]
[im 1/24]
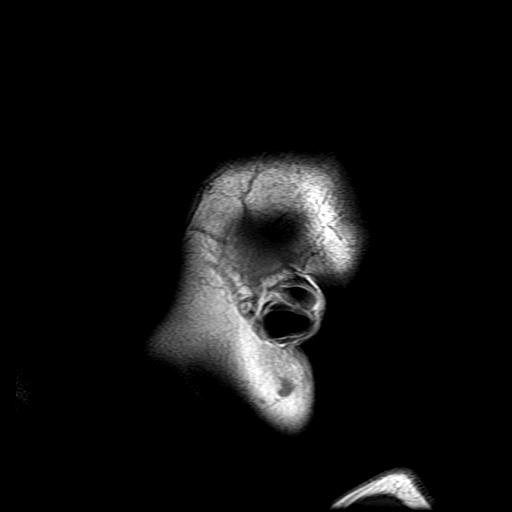
[im 12/24]
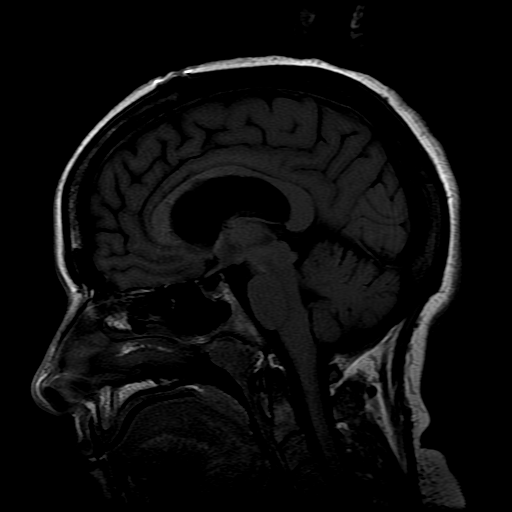

[Series 5: DWI · coronal · 4.0mm · 1.09mm/px · 6 of 68 slices shown (2 of 4)]
[im 1/68]
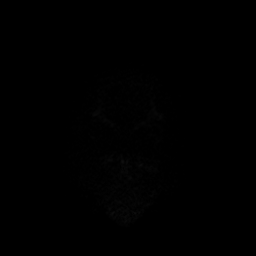
[im 14/68]
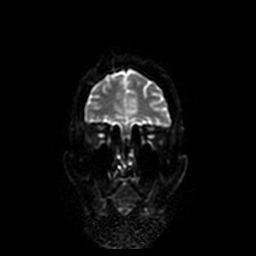
[im 27/68]
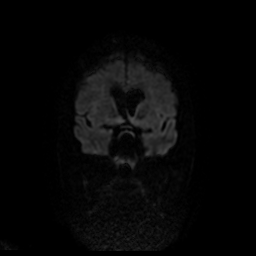
[im 41/68]
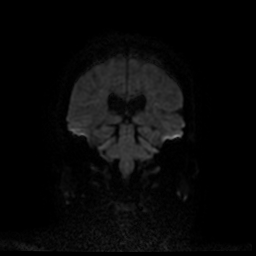
[im 54/68]
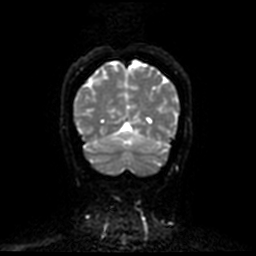
[im 68/68]
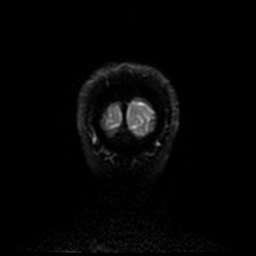

[Series 6: T2 · axial · 5.0mm · 0.43mm/px · z∈[-81,+69]mm · 2 of 23 slices shown]
[im 1/23]
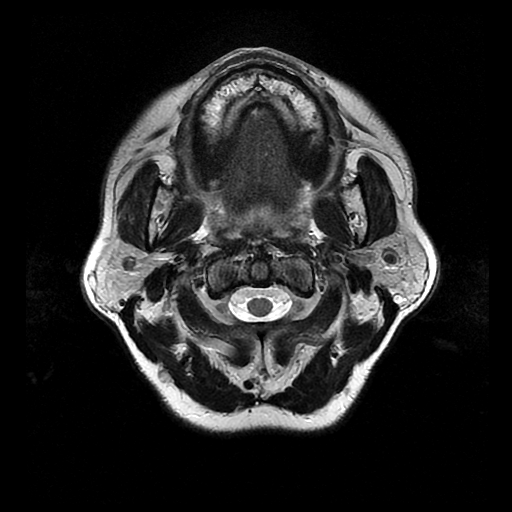
[im 23/23]
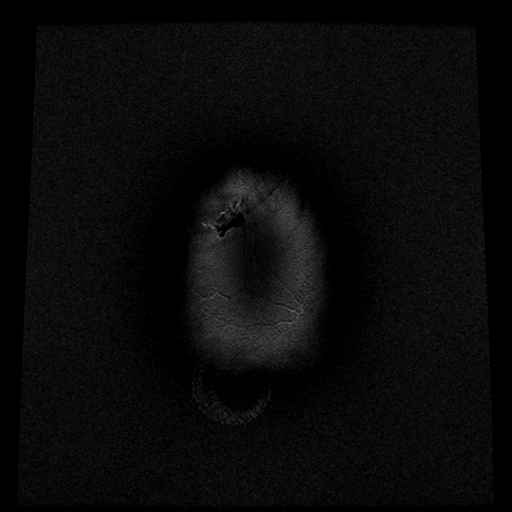

[Series 7: FLAIR · axial · 3.0mm · 0.43mm/px · z∈[-82,+70]mm · 2 of 27 slices shown]
[im 1/27]
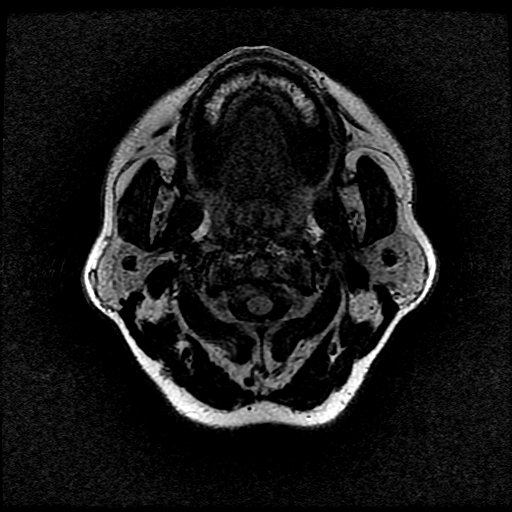
[im 27/27]
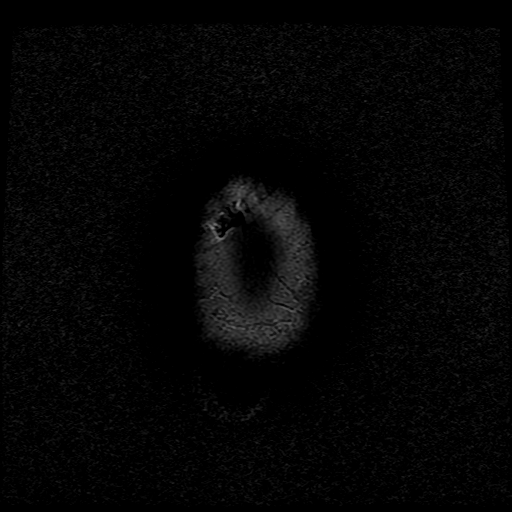

[Series 10: T2 post-contrast · coronal · 5.0mm · 0.45mm/px · 2 of 27 slices shown]
[im 1/27]
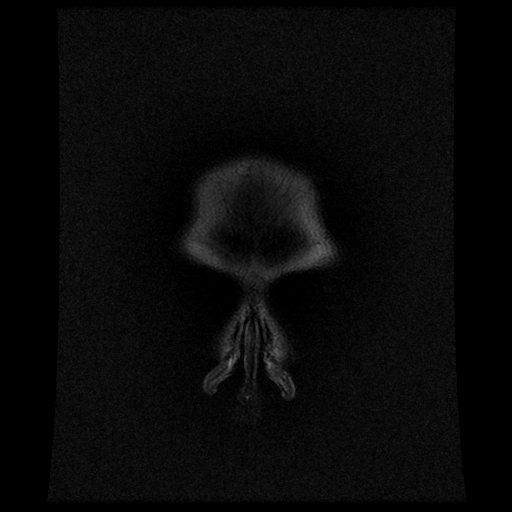
[im 27/27]
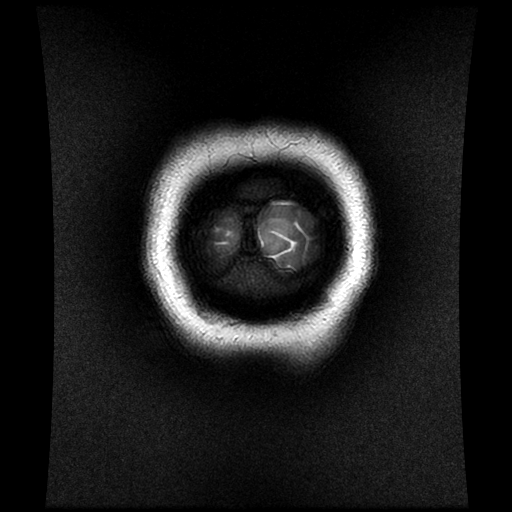

[Series 12: T1 post-contrast · coronal · 5.0mm · 0.45mm/px · 2 of 27 slices shown (1 of 2)]
[im 1/27]
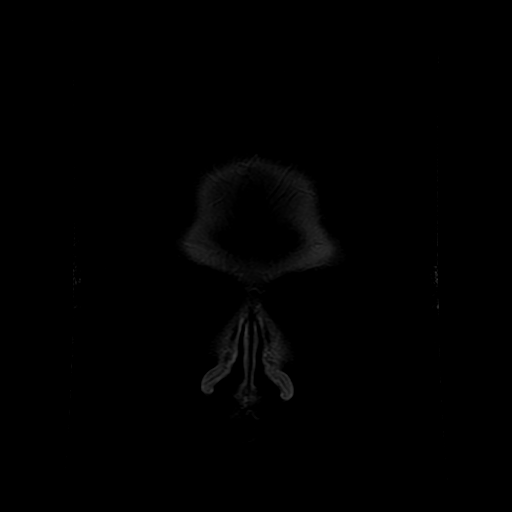
[im 27/27]
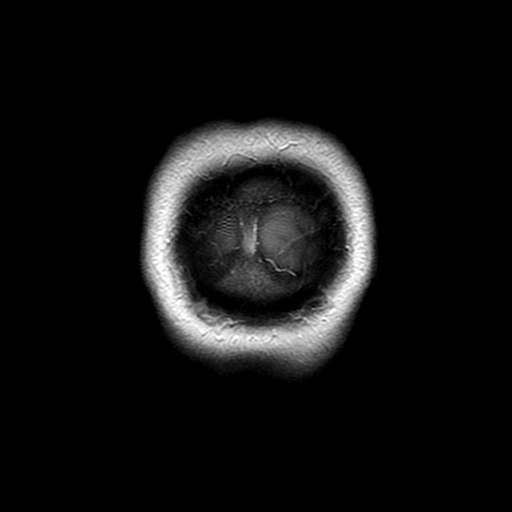

[Series 13: T1 post-contrast · sagittal · 5.0mm · 0.47mm/px · 2 of 24 slices shown (2 of 2)]
[im 1/24]
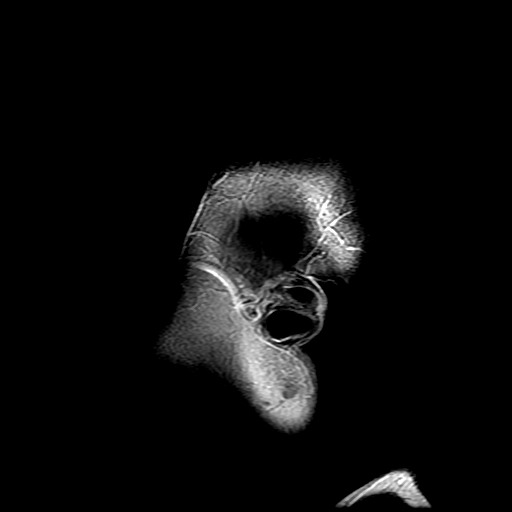
[im 24/24]
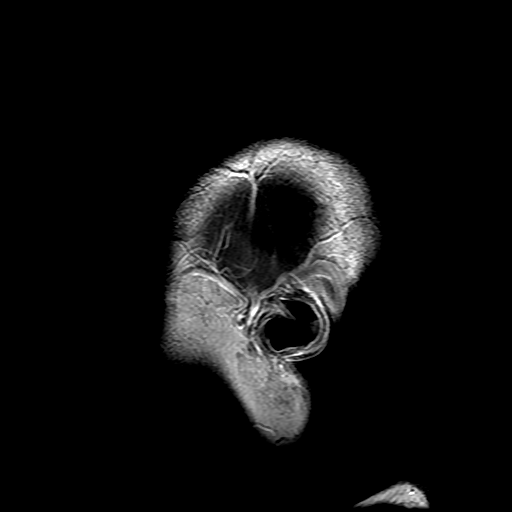

[Series 300: DWI · axial · 3.0mm · 1.09mm/px · z∈[-82,+64]mm · 4 of 51 slices shown (3 of 4)]
[im 1/51]
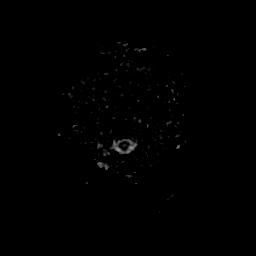
[im 17/51]
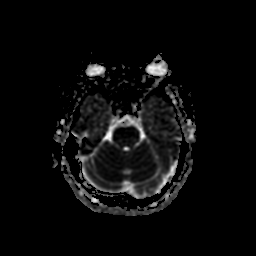
[im 34/51]
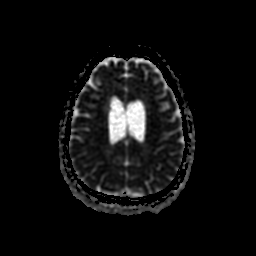
[im 51/51]
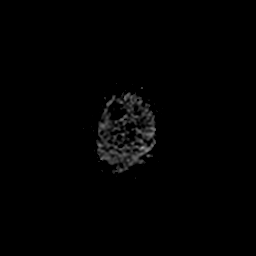

[Series 500: DWI · coronal · 4.0mm · 1.09mm/px · 3 of 34 slices shown (4 of 4)]
[im 1/34]
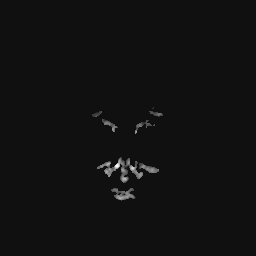
[im 17/34]
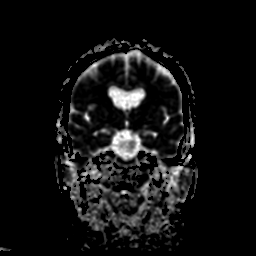
[im 34/34]
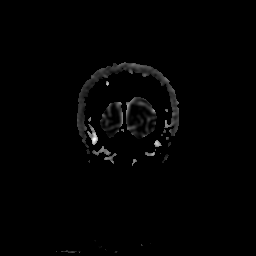

[33 of 48 positions shown; findings below may reference images not displayed]

FINDINGS: Brain: Sequelae of right frontal craniotomy for intraventricular
tumor resection are again identified. Encephalomalacia and gliosis
are again seen along the operative tract in the right frontal lobe
with associated chronic blood products. Minimal curvilinear
enhancement along the operative tract at the margin of the lateral
ventricle has decreased and is likely postoperative. No recurrent
mass is identified. The ventricles are unchanged in size.

There is no evidence of acute infarct, midline shift, or extra-axial
fluid collection. A single subcentimeter focus of T2 hyperintensity
in the left frontal white matter is unchanged and nonspecific.

Vascular: Major intracranial vascular flow voids are preserved.

Skull and upper cervical spine: Unremarkable bone marrow signal.

Sinuses/Orbits: Unremarkable orbits. Paranasal sinuses and mastoid
air cells are clear.

Other: Mildly prominent posterior nasopharyngeal/adenoidal soft
tissues for age, unchanged and without suspicious focal lesion.
IMPRESSION: Postoperative changes without evidence of recurrent tumor.
